# Patient Record
Sex: Female | Born: 1965 | Race: Black or African American | Hispanic: No | Marital: Single | Smoking: Current every day smoker
Health system: Southern US, Community
[De-identification: ages and names within clinical notes are randomized; demographics above are authoritative.]

## PROBLEM LIST (undated history)

## (undated) DIAGNOSIS — Z5189 Encounter for other specified aftercare: Secondary | ICD-10-CM

## (undated) DIAGNOSIS — H543 Unqualified visual loss, both eyes: Secondary | ICD-10-CM

## (undated) DIAGNOSIS — R7303 Prediabetes: Secondary | ICD-10-CM

## (undated) DIAGNOSIS — F419 Anxiety disorder, unspecified: Secondary | ICD-10-CM

## (undated) HISTORY — PX: POLYPECTOMY: SHX149

## (undated) HISTORY — DX: Anxiety disorder, unspecified: F41.9

## (undated) HISTORY — PX: EYE SURGERY: SHX253

## (undated) HISTORY — DX: Unqualified visual loss, both eyes: H54.3

## (undated) HISTORY — DX: Prediabetes: R73.03

## (undated) HISTORY — DX: Encounter for other specified aftercare: Z51.89

---

## 1998-11-17 DIAGNOSIS — G35D Multiple sclerosis, unspecified: Secondary | ICD-10-CM

## 1998-11-17 DIAGNOSIS — G35 Multiple sclerosis: Secondary | ICD-10-CM

## 1998-11-17 HISTORY — DX: Multiple sclerosis: G35

## 1998-11-17 HISTORY — DX: Multiple sclerosis, unspecified: G35.D

## 2001-02-18 ENCOUNTER — Emergency Department (HOSPITAL_COMMUNITY): Admission: EM | Admit: 2001-02-18 | Discharge: 2001-02-18 | Payer: Self-pay | Admitting: Emergency Medicine

## 2001-05-03 ENCOUNTER — Inpatient Hospital Stay (HOSPITAL_COMMUNITY): Admission: EM | Admit: 2001-05-03 | Discharge: 2001-05-05 | Payer: Self-pay | Admitting: Emergency Medicine

## 2001-05-04 ENCOUNTER — Encounter: Payer: Self-pay | Admitting: Internal Medicine

## 2001-08-02 ENCOUNTER — Emergency Department (HOSPITAL_COMMUNITY): Admission: EM | Admit: 2001-08-02 | Discharge: 2001-08-02 | Payer: Self-pay | Admitting: Emergency Medicine

## 2017-04-18 ENCOUNTER — Encounter (HOSPITAL_COMMUNITY): Payer: Self-pay | Admitting: Emergency Medicine

## 2017-04-18 ENCOUNTER — Ambulatory Visit (HOSPITAL_COMMUNITY)
Admission: EM | Admit: 2017-04-18 | Discharge: 2017-04-18 | Disposition: A | Discharge status: Home / Self Care | Payer: Medicaid Other | Attending: Internal Medicine | Admitting: Internal Medicine

## 2017-04-18 DIAGNOSIS — H579 Unspecified disorder of eye and adnexa: Secondary | ICD-10-CM

## 2017-04-18 DIAGNOSIS — Z760 Encounter for issue of repeat prescription: Secondary | ICD-10-CM | POA: Diagnosis not present

## 2017-04-18 NOTE — Discharge Instructions (Signed)
Please contact the opthamologist as I have listed for a f/u appt this week. Please explain the situation to move forward with appt.

## 2017-04-18 NOTE — ED Provider Notes (Signed)
CSN: [id number]     Arrival date & time 04/18/17  1634 History   First MD Initiated Contact with Patient 04/18/17 1709     Chief Complaint  Patient presents with  . Medication Refill   (Consider location/radiation/quality/duration/timing/severity/associated sxs/prior Treatment)  51 yo presents for a refill of prednisone 20mg /day. She presents from [place] to [place] area. She carries a history of "some sort of eye inflammation". She does not know the condition of this. She states that her eyes become watery and itching and sensitive to light. She has taken PRN prednisone when this occurs "for years" and it helps her. She has been out of meds since April. She reports some itchiness to her eyes, no pain. She wants a refill only. She reports some light sensitivity and states that her eyes have been foggy for years without acute change. She is waiting on Medicaid to come through also.         History reviewed. No pertinent past medical history. History reviewed. No pertinent surgical history. History reviewed. No pertinent family history. Social History  Substance Use Topics  . Smoking status: Never Smoker  . Smokeless tobacco: Never Used  . Alcohol use No   OB History    No data available     Review of Systems  All other systems reviewed and are negative.   Allergies  Patient has no known allergies.  Home Medications   Prior to Admission medications   Medication Sig Start Date End Date Taking? Authorizing Provider  predniSONE (DELTASONE) 20 MG tablet Take 20 mg by mouth daily with breakfast.   Yes [provider]   Meds Ordered and Administered this Visit  Medications - No data to display  BP 113/76 (BP Location: Left Arm)   Pulse 95   Temp 98.8 F (37.1 C) (Oral)   Resp 16   SpO2 100%  No data found.   Physical Exam  Constitutional: She is oriented to person, place, and time. She appears well-developed and well-nourished. No distress.  Eyes:  Conjunctivae and EOM are normal. Pupils are equal, round, and reactive to light.  Mild clear discharge from both eyes, mild injection otherwise normal  Neurological: She is alert and oriented to person, place, and time.  Skin: Skin is warm and dry. She is not diaphoretic.  Psychiatric: Her behavior is normal.  Nursing note and vitals reviewed.   Urgent Care Course     Procedures (including critical care time)  Labs Review Labs Reviewed - No data to display  Imaging Review No results found.   Visual Acuity Review  Right Eye Distance:   Left Eye Distance:   Bilateral Distance:    Right Eye Near:   Left Eye Near:    Bilateral Near:         MDM   1. Medication refill   Patient presents for a medication refill of 20mg  of prednisone in which she describes for an "inflammatory eye condition". The terms uveitis or iritis was foreign to her. She did not have acute symptoms, but some watery eyes on exam and long term vision changes. I was happy to prescribe 1 week of prednisone but ultimately I wanted her to see on call Opthalmology for appropriate treatments. I provided her with contact information. Failure to use prednisone orally or dropsc an worsen inflammatory eye if that's truly what this is and cause permanent vision loss therefore again offered to provide prednisone and she refused any further treatment from me today and ask  to leave as she states she has "been through this before". Discharge orders were given to have prompt f/u with Opthalmology for this.    Bjorn Pippin, PA-C 04/18/17 1745

## 2017-04-18 NOTE — ED Triage Notes (Signed)
Pt is here needing a refill on prednisone that she uses for inflammation of eyes  A&IO x4... NAD... Ambulatory

## 2017-04-26 ENCOUNTER — Emergency Department (HOSPITAL_COMMUNITY)
Admission: EM | Admit: 2017-04-26 | Discharge: 2017-04-26 | Disposition: A | Discharge status: Home / Self Care | Payer: Medicaid Other | Attending: Emergency Medicine | Admitting: Emergency Medicine

## 2017-04-26 ENCOUNTER — Emergency Department (HOSPITAL_COMMUNITY): Payer: Medicaid Other

## 2017-04-26 ENCOUNTER — Encounter (HOSPITAL_COMMUNITY): Payer: Self-pay

## 2017-04-26 DIAGNOSIS — R1010 Upper abdominal pain, unspecified: Secondary | ICD-10-CM | POA: Insufficient documentation

## 2017-04-26 DIAGNOSIS — R072 Precordial pain: Secondary | ICD-10-CM | POA: Insufficient documentation

## 2017-04-26 DIAGNOSIS — R109 Unspecified abdominal pain: Secondary | ICD-10-CM

## 2017-04-26 DIAGNOSIS — R918 Other nonspecific abnormal finding of lung field: Secondary | ICD-10-CM | POA: Insufficient documentation

## 2017-04-26 DIAGNOSIS — J189 Pneumonia, unspecified organism: Secondary | ICD-10-CM | POA: Diagnosis not present

## 2017-04-26 LAB — COMPREHENSIVE METABOLIC PANEL
ALBUMIN: 4.2 g/dL (ref 3.5–5.0)
ALK PHOS: 50 U/L (ref 38–126)
ALT: 12 U/L — ABNORMAL LOW (ref 14–54)
ANION GAP: 6 (ref 5–15)
AST: 23 U/L (ref 15–41)
BUN: 10 mg/dL (ref 6–20)
CO2: 25 mmol/L (ref 22–32)
Calcium: 9.6 mg/dL (ref 8.9–10.3)
Chloride: 107 mmol/L (ref 101–111)
Creatinine, Ser: 0.63 mg/dL (ref 0.44–1.00)
GFR calc Af Amer: >60 mL/min (ref >60)
GLUCOSE: 107 mg/dL — AB (ref 65–99)
Potassium: 3.7 mmol/L (ref 3.5–5.1)
Sodium: 138 mmol/L (ref 135–145)
TOTAL PROTEIN: 7.3 g/dL (ref 6.5–8.1)
Total Bilirubin: 0.8 mg/dL (ref 0.3–1.2)

## 2017-04-26 LAB — URINALYSIS, ROUTINE W REFLEX MICROSCOPIC
Bilirubin Urine: NEGATIVE
GLUCOSE, UA: NEGATIVE mg/dL
Ketones, ur: NEGATIVE mg/dL
NITRITE: NEGATIVE
Protein, ur: 30 mg/dL — AB
SPECIFIC GRAVITY, URINE: 1.03 (ref 1.005–1.030)
pH: 5 (ref 5.0–8.0)

## 2017-04-26 LAB — CBC
HCT: 36 % (ref 36.0–46.0)
HEMOGLOBIN: 12.3 g/dL (ref 12.0–15.0)
MCH: 29.4 pg (ref 26.0–34.0)
MCHC: 34.2 g/dL (ref 30.0–36.0)
MCV: 86.1 fL (ref 78.0–100.0)
Platelets: 164 10*3/uL (ref 150–400)
RBC: 4.18 MIL/uL (ref 3.87–5.11)
RDW: 13.9 % (ref 11.5–15.5)
WBC: 5.2 10*3/uL (ref 4.0–10.5)

## 2017-04-26 LAB — LIPASE, BLOOD: Lipase: 24 U/L (ref 11–51)

## 2017-04-26 LAB — I-STAT TROPONIN, ED: Troponin i, poc: 0 ng/mL (ref 0.00–0.08)

## 2017-04-26 LAB — I-STAT BETA HCG BLOOD, ED (MC, WL, AP ONLY): I-stat hCG, quantitative: <5 m[IU]/mL (ref <5)

## 2017-04-26 MED ORDER — IOPAMIDOL (ISOVUE-300) INJECTION 61%
INTRAVENOUS | Status: AC
  Administered 2017-04-26: 75 mL
  Filled 2017-04-26: qty 75

## 2017-04-26 MED ORDER — MORPHINE SULFATE (PF) 4 MG/ML IV SOLN
4.0000 mg | Freq: Once | INTRAVENOUS | Status: AC
  Administered 2017-04-26: 4 mg via INTRAVENOUS
  Filled 2017-04-26: qty 1

## 2017-04-26 MED ORDER — ONDANSETRON HCL 4 MG/2ML IJ SOLN
4.0000 mg | Freq: Once | INTRAMUSCULAR | Status: AC
  Administered 2017-04-26: 4 mg via INTRAVENOUS
  Filled 2017-04-26: qty 2

## 2017-04-26 MED ORDER — AZITHROMYCIN 250 MG PO TABS: 250.0000 mg | ORAL_TABLET | Freq: Every day | ORAL | 0 refills | Status: DC

## 2017-04-26 MED ORDER — KETOROLAC TROMETHAMINE 30 MG/ML IJ SOLN
15.0000 mg | Freq: Once | INTRAMUSCULAR | Status: AC
  Administered 2017-04-26: 15 mg via INTRAVENOUS
  Filled 2017-04-26: qty 1

## 2017-04-26 MED ORDER — NAPROXEN 250 MG PO TABS: 250.0000 mg | ORAL_TABLET | Freq: Two times a day (BID) | ORAL | 0 refills | Status: DC

## 2017-04-26 MED ORDER — SODIUM CHLORIDE 0.9 % IV BOLUS (SEPSIS)
1000.0000 mL | Freq: Once | INTRAVENOUS | Status: AC
  Administered 2017-04-26: 1000 mL via INTRAVENOUS

## 2017-04-26 NOTE — Discharge Instructions (Signed)
Please follow up with primary care and have a repeat CT scan of your chest in about 6-12 weeks.

## 2017-04-26 NOTE — ED Provider Notes (Signed)
[facility] DEPT Provider Note   CSN: [id number] Arrival date & time: 04/26/17  1423     History   Chief Complaint Chief Complaint  Patient presents with  . Abdominal Pain    HPI Katherine Le is a 51 y.o. female.  Katherine Le is a 51 y.o. Female who presents the emergency department complaining of 4 days of left flank pain as well as onset of chest pain on arrival to the emergency department today. Patient reports she's had 4 days of ongoing left flank pain that can fluctuate in intensity but pain is constant. She reports her pain is also worse with certain movements and walking. She denies urinary symptoms. No history of kidney stones. She is taken nothing for treatment of her symptoms today. Patient also reports on arrival to the emergency department she started having left-sided chest pain. She tells me this is nonradiating and denies any shortness of breath. She does report if she breathes deeply she has increased pain to her left flank. She denies history of MI, PE or DVT. She denies shortness of breath. She denies fevers, vomiting, diarrhea, hematuria, dysuria, urinary frequency, urinary urgency, decreased urination, shortness of breath, palpitations, leg pain, leg swelling, recent long travel, rashes or syncope.   The history is provided by the patient and medical records. No language interpreter was used.  Abdominal Pain   Pertinent negatives include fever, diarrhea, nausea, vomiting, dysuria, hematuria and headaches.    History reviewed. No pertinent past medical history.  There are no active problems to display for this patient.   History reviewed. No pertinent surgical history.  OB History    No data available       Home Medications    Prior to Admission medications   Medication Sig Start Date End Date Taking? Authorizing Provider  azithromycin (ZITHROMAX Z-PAK) 250 MG tablet Take 1 tablet (250 mg total) by mouth daily. 500mg  PO day 1, then 250mg  PO  days 205 04/26/17   Waynetta Pean, PA-C  naproxen (NAPROSYN) 250 MG tablet Take 1 tablet (250 mg total) by mouth 2 (two) times daily with a meal. 04/26/17   Waynetta Pean, PA-C    Family History No family history on file.  Social History Social History  Substance Use Topics  . Smoking status: Never Smoker  . Smokeless tobacco: Never Used  . Alcohol use No     Allergies   Patient has no known allergies.   Review of Systems Review of Systems  Constitutional: Negative for chills and fever.  HENT: Negative for congestion and sore throat.   Eyes: Negative for visual disturbance.  Respiratory: Negative for cough, shortness of breath and wheezing.   Cardiovascular: Positive for chest pain. Negative for palpitations and leg swelling.  Gastrointestinal: Positive for abdominal pain. Negative for diarrhea, nausea and vomiting.  Genitourinary: Positive for flank pain. Negative for decreased urine volume, difficulty urinating, dysuria, hematuria and urgency.  Musculoskeletal: Negative for back pain and neck pain.  Skin: Negative for rash.  Neurological: Negative for syncope, weakness, light-headedness and headaches.     Physical Exam Updated Vital Signs BP 111/72   Pulse 66   Temp 99.1 F (37.3 C) (Oral)   Resp (!) 22   SpO2 96%   Physical Exam  Constitutional: She appears well-developed and well-nourished. No distress.  Nontoxic appearing.  HENT:  Head: Normocephalic and atraumatic.  Mouth/Throat: Oropharynx is clear and moist.  Eyes: Conjunctivae are normal. Pupils are equal, round, and reactive to light. Right eye exhibits  no discharge. Left eye exhibits no discharge.  Neck: Normal range of motion. Neck supple. No JVD present. No tracheal deviation present.  Cardiovascular: Normal rate, regular rhythm, normal heart sounds and intact distal pulses.  Exam reveals no gallop and no friction rub.   No murmur heard. Bilateral radial, posterior tibialis and dorsalis pedis  pulses are intact.    Pulmonary/Chest: Effort normal and breath sounds normal. No stridor. No respiratory distress. She has no wheezes. She has no rales. She exhibits tenderness.  Lungs are clear to ascultation bilaterally. Symmetric chest expansion bilaterally. No increased work of breathing. No rales or rhonchi.  Left sided chest wall is TTP and reproduces her pain.   Abdominal: Soft. Bowel sounds are normal. She exhibits no distension and no mass. There is tenderness. There is no rebound and no guarding.  Abdomen is soft. Bowel sounds are present. Patient has a left flank tenderness to palpation. No peritoneal signs. No lower abdominal tenderness to palpation.  Musculoskeletal: Normal range of motion. She exhibits no edema or tenderness.  Lymphadenopathy:    She has no cervical adenopathy.  Neurological: She is alert. No sensory deficit. Coordination normal.  Skin: Skin is warm and dry. Capillary refill takes less than 2 seconds. No rash noted. She is not diaphoretic. No erythema. No pallor.  Psychiatric: She has a normal mood and affect. Her behavior is normal.  Nursing note and vitals reviewed.    ED Treatments / Results  Labs (all labs ordered are listed, but only abnormal results are displayed) Labs Reviewed  COMPREHENSIVE METABOLIC PANEL - Abnormal; Notable for the following:       Result Value   Glucose, Bld 107 (*)    ALT 12 (*)    All other components within normal limits  URINALYSIS, ROUTINE W REFLEX MICROSCOPIC - Abnormal; Notable for the following:    APPearance HAZY (*)    Hgb urine dipstick LARGE (*)    Protein, ur 30 (*)    Leukocytes, UA TRACE (*)    Bacteria, UA RARE (*)    Squamous Epithelial / LPF 6-30 (*)    All other components within normal limits  LIPASE, BLOOD  CBC  I-STAT BETA HCG BLOOD, ED ([facility], [facility], [facility] ONLY)  I-STAT TROPOININ, ED    EKG  EKG Interpretation  Date/Time:  Sunday April 26 2017 17:00:25 EDT Ventricular Rate:  73 PR Interval:    QRS  Duration: 78 QT Interval:  370 QTC Calculation: 408 R Axis:   35 Text Interpretation:  Sinus rhythm Abnormal R-wave progression, early transition Borderline T abnormalities, diffuse leads Confirmed by Hazle Coca [phone]) on 04/26/2017 5:02:40 PM       Radiology Dg Chest 2 View  Result Date: 04/26/2017 CLINICAL DATA:  Left abdominal pain and nausea for the past 4 days. Smoker. EXAM: CHEST  2 VIEW COMPARISON:  Report dated 05/03/2001 FINDINGS: Normal sized heart. Tortuous aorta. Mildly elevated left hemidiaphragm. Mild diffuse peribronchial thickening and accentuation of the interstitial markings. Unremarkable bones. IMPRESSION: No acute abnormality.  Mild chronic bronchitic changes. Electronically Signed   By: Claudie Revering M.D.   On: 04/26/2017 17:47   Ct Chest W Contrast  Result Date: 04/26/2017 CLINICAL DATA:  Chest and left flank pain today. Stone study demonstrating bibasilar pulmonary nodules. Nonsmoker. No history of primary malignancy. EXAM: CT CHEST WITH CONTRAST TECHNIQUE: Multidetector CT imaging of the chest was performed during intravenous contrast administration. CONTRAST:  42mL ISOVUE-300 IOPAMIDOL (ISOVUE-300) INJECTION 61% COMPARISON:  Abdominal CT of earlier  today. FINDINGS: Cardiovascular: Tortuous thoracic aorta. Normal heart size, without pericardial effusion. Pulmonary artery enlargement is mild, with a 3.2 cm outflow tract. Mediastinum/Nodes: No supraclavicular adenopathy. No axillary adenopathy. No mediastinal or hilar adenopathy. Soft tissue density in the anterior mediastinum is likely residual thymus. Lungs/Pleura: No pleural fluid. Anteromedial right upper lobe patchy airspace disease and bronchiectasis on image 54/series 7. Bilateral ill-defined pulmonary nodules, with larger areas of right base nodularity with traversing air bronchograms, favoring infectious etiology. Example in the posterior right upper lobe on image 71/ series 7 and 13 mm and within the right lower lobe on  image 81/series 7 at 10 mm. Upper Abdomen: Normal imaged portions of the liver, spleen, stomach, pancreas, adrenal glands, kidneys. Musculoskeletal: No acute osseous abnormality. IMPRESSION: 1. Bilateral ill-defined pulmonary nodules with areas of nodular airspace disease in the right upper and lower lobes. Findings favor infection, including atypical bacterial or fungal etiologies. Correlate with infectious symptoms. Recommend appropriate antibiotic therapy and follow-up chest CT at 6-12 weeks. 2. No thoracic adenopathy. Probable residual thymic tissue in the anterior mediastinum. Recommend attention on follow-up. 3. Pulmonary artery enlargement suggests pulmonary arterial hypertension. Electronically Signed   By: Abigail Miyamoto M.D.   On: 04/26/2017 21:20   Ct Renal Stone Study  Result Date: 04/26/2017 CLINICAL DATA:  Left flank and back pain for the past 3 days. EXAM: CT ABDOMEN AND PELVIS WITHOUT CONTRAST TECHNIQUE: Multidetector CT imaging of the abdomen and pelvis was performed following the standard protocol without IV contrast. COMPARISON:  Abdomen and pelvis CT report dated 05/03/2001 and abdomen ultrasound report dated 05/04/2001. FINDINGS: Lower chest: Breathing motion blurring. 1.4 x 1.1 cm mildly irregular, rounded nodular density in the posterior aspect of the right middle lobe, measured on axial image number 8 of series 4. 1.0 x 0.9 cm oval, mildly irregular nodular density in the right lower lobe on image number 12 of series 4, containing an air bronchograms on sagittal image number 28. 5 mm irregular nodular density in the superior aspect of the right middle lobe on image number 5 of series 4 and on sagittal image number 26. 3 mm nodular density in the right middle lobe on image number 8 of series 4 and sagittal image number 32. 2 mm nodular density in left lower lobe on image number 7 of series 4. 4 mm nodular density in the lingula on image number 6 of series 4 and sagittal image number 83. 5 mm  nodular density in the lingula on image number 9 of series 4 and on sagittal image number 78. Hepatobiliary: No focal liver abnormality is seen. No gallstones, gallbladder wall thickening, or biliary dilatation. Pancreas: Unremarkable. No pancreatic ductal dilatation or surrounding inflammatory changes. Spleen: Normal in size without focal abnormality. Adrenals/Urinary Tract: Adrenal glands are unremarkable. Kidneys are normal, without renal calculi, focal lesion, or hydronephrosis. Bladder is unremarkable. Stomach/Bowel: Unremarkable stomach, small bowel and colon. No evidence of appendicitis. Vascular/Lymphatic: No significant vascular findings are present. No enlarged abdominal or pelvic lymph nodes. Reproductive: Enlarged uterus containing at least 2 fibroids. No adnexal masses. Other: None. Musculoskeletal: L5-S1 facet degenerative changes. IMPRESSION: 1. Multiple irregular nodular densities in both lungs, as described above. These have appearances concerning for multifocal infection, such as fungal infection. A neoplastic process is less likely but not excluded. A chest CT with contrast would be useful for assessing extensive disease and assessing for associated adenopathy. 2. No acute abdominal or pelvic abnormality. 3. Uterine fibroids. Electronically Signed   By: Claudie Revering  M.D.   On: 04/26/2017 18:44    Procedures Procedures (including critical care time)  Medications Ordered in ED Medications  sodium chloride 0.9 % bolus 1,000 mL (0 mLs Intravenous Stopped 04/26/17 1927)  morphine 4 MG/ML injection 4 mg (4 mg Intravenous Given 04/26/17 1713)  ondansetron (ZOFRAN) injection 4 mg (4 mg Intravenous Given 04/26/17 1714)  ketorolac (TORADOL) 30 MG/ML injection 15 mg (15 mg Intravenous Given 04/26/17 1933)  iopamidol (ISOVUE-300) 61 % injection (75 mLs  Contrast Given 04/26/17 2040)     Initial Impression / Assessment and Plan / ED Course  I have reviewed the triage vital signs and the nursing  notes.  Pertinent labs & imaging results that were available during my care of the patient were reviewed by me and considered in my medical decision making (see chart for details).    This  is a 51 y.o. Female who presents the emergency department complaining of 4 days of left flank pain as well as onset of chest pain on arrival to the emergency department today. Patient reports she's had 4 days of ongoing left flank pain that can fluctuate in intensity but pain is constant. She reports her pain is also worse with certain movements and walking. She denies urinary symptoms. No history of kidney stones. She is taken nothing for treatment of her symptoms today. Patient also reports on arrival to the emergency department she started having left-sided chest pain. She tells me this is nonradiating and denies any shortness of breath. She does report if she breathes deeply she has increased pain to her left flank. She denies history of MI, PE or DVT. She denies shortness of breath. She denies fevers, vomiting, diarrhea, hematuria, dysuria, urinary frequency, urinary urgency. On exam the patient is afebrile nontoxic appearing. Lungs clear to auscultation bilaterally. She has substernal chest wall tenderness to palpation which reproduces her pain. She has mild left flank tenderness to palpation. Abdomen is soft and nontender. Lower extremity edema or tenderness. EKG shows no evidence of STEMI. Troponin is not elevated. Pregnancy test is negative. Lipase is within normal limits. CMP is unremarkable. Preserved kidney function. CBC is within normal limits. No leukocytosis. Urinalysis shows no evidence of infection, but large hemoglobin. Chest x-ray showed some mild chronic bronchitic changes but no acute abnormality. CT renal stone study showed no acute abdominal or pelvic abnormality. It did show multiple irregular nodular densities in both lungs that are concerning for multifocal infection or fungal infection.  Recommend CT chest with contrast. Patient agrees with plan for CT chest with contrast. CT chest with contrast shows bilateral pulmonary nodules with area of nodular airspace disease in the right upper and lower lobes. Findings favor infection including atypical bacterial or fungal etiologies. Recommends follow-up chest CT in 6-12 weeks. There is also suggestion of pulmonary arterial hypertension. At reevaluation patient is resting comfortably in the room. She is tolerating by mouth. She is feeling better after pain medication in the emergency department. I discussed all of the test results. We'll discharge with azithromycin to cover atypical bacterial infections. Patient denies having any increased cough or fevers. No recent travel. Will have her follow up with primary care and have a repeat chest CT in about 6-12 weeks. Naproxen for her flank pain. Suspect this is musculoskeletal. Return precautions discussed. I advised the patient to follow-up with their primary care provider this week. I advised the patient to return to the emergency department with new or worsening symptoms or new concerns. The patient verbalized understanding  and agreement with plan.      Final Clinical Impressions(s) / ED Diagnoses   Final diagnoses:  Atypical pneumonia  Pulmonary nodules/lesions, multiple  Left flank pain  Precordial pain    New Prescriptions Discharge Medication List as of 04/26/2017 11:15 PM    START taking these medications   Details  azithromycin (ZITHROMAX Z-PAK) 250 MG tablet Take 1 tablet (250 mg total) by mouth daily. 500mg  PO day 1, then 250mg  PO days 205, Starting Sun 04/26/2017, Print    naproxen (NAPROSYN) 250 MG tablet Take 1 tablet (250 mg total) by mouth 2 (two) times daily with a meal., Starting Sun 04/26/2017, Print         Waynetta Pean, PA-C 04/26/17 2339    Quintella Reichert, MD 04/30/17 [phone]

## 2017-04-26 NOTE — ED Triage Notes (Signed)
Patient complains of left sided abdominal pain with nausea x 1 week. States that her pain is worse with movement and inspiration. No vag. discharge

## 2018-11-17 HISTORY — PX: COLONOSCOPY: SHX174

## 2020-03-21 ENCOUNTER — Telehealth: Payer: Self-pay

## 2020-03-22 NOTE — Telephone Encounter (Signed)
ERROR

## 2020-11-01 ENCOUNTER — Emergency Department (HOSPITAL_COMMUNITY)
Admission: EM | Admit: 2020-11-01 | Discharge: 2020-11-02 | Disposition: A | Discharge status: Home / Self Care | Payer: Medicaid Other | Attending: Emergency Medicine | Admitting: Emergency Medicine

## 2020-11-01 ENCOUNTER — Other Ambulatory Visit: Payer: Self-pay

## 2020-11-01 DIAGNOSIS — G35 Multiple sclerosis: Secondary | ICD-10-CM | POA: Insufficient documentation

## 2020-11-01 DIAGNOSIS — M62838 Other muscle spasm: Secondary | ICD-10-CM | POA: Diagnosis present

## 2020-11-01 LAB — BASIC METABOLIC PANEL
Anion gap: 8 (ref 5–15)
BUN: 15 mg/dL (ref 6–20)
CO2: 26 mmol/L (ref 22–32)
Calcium: 9.7 mg/dL (ref 8.9–10.3)
Chloride: 104 mmol/L (ref 98–111)
Creatinine, Ser: 0.74 mg/dL (ref 0.44–1.00)
GFR, Estimated: >60 mL/min (ref >60)
Glucose, Bld: 113 mg/dL — ABNORMAL HIGH (ref 70–99)
Potassium: 4 mmol/L (ref 3.5–5.1)
Sodium: 138 mmol/L (ref 135–145)

## 2020-11-01 LAB — CBC
HCT: 36.5 % (ref 36.0–46.0)
Hemoglobin: 12.1 g/dL (ref 12.0–15.0)
MCH: 29.1 pg (ref 26.0–34.0)
MCHC: 33.2 g/dL (ref 30.0–36.0)
MCV: 87.7 fL (ref 80.0–100.0)
Platelets: 193 10*3/uL (ref 150–400)
RBC: 4.16 MIL/uL (ref 3.87–5.11)
RDW: 13.4 % (ref 11.5–15.5)
WBC: 5.9 10*3/uL (ref 4.0–10.5)
nRBC: 0 % (ref 0.0–0.2)

## 2020-11-01 NOTE — ED Triage Notes (Signed)
Pt here after running out of her medications. Pt with hx MS and states she feels like it is flaring up. Pt c/o burning to R leg and having spasms.

## 2020-11-02 ENCOUNTER — Other Ambulatory Visit: Payer: Self-pay

## 2020-11-02 MED ORDER — HYDROCODONE-ACETAMINOPHEN 5-325 MG PO TABS: 1.0000 | ORAL_TABLET | ORAL | 0 refills | Status: AC | PRN

## 2020-11-02 MED ORDER — PREDNISONE 10 MG PO TABS: ORAL_TABLET | ORAL | 0 refills | Status: DC

## 2020-11-02 NOTE — ED Notes (Signed)
Pt ambulatory to bathroom with steady gait and no assistance. NAD noted.

## 2020-11-02 NOTE — Discharge Instructions (Addendum)
Schedule to see Neurology for evaluation.

## 2020-11-02 NOTE — ED Provider Notes (Signed)
[facility]ERGENC[facility] DEPARTMENT Provider Note   CSN: [id number] Arrival date & time: 11/01/20  1707     History Chief Complaint  Patient presents with  . Multiple Sclerosis  . Spasms    Katherine Le is a 54 y.o. female.  Pt complains of body aches.  Pt reports she is out of her medications.  Pt moved here from [place].  Pt reports she went to adult [facility] and is suppose to be scheduled with Neurology.  Pt has not had medication since October.Pt complains of bodyaches   The history is provided by the patient. No language interpreter was used.       No past medical history on file.  There are no problems to display for this patient.   No past surgical history on file.   OB History   No obstetric history on file.     No family history on file.  Social History   Tobacco Use  . Smoking status: Never Smoker  . Smokeless tobacco: Never Used  Substance Use Topics  . Alcohol use: No  . Drug use: No    Home Medications Prior to Admission medications   Medication Sig Start Date End Date Taking? Authorizing Provider  azithromycin (ZITHROMAX Z-PAK) 250 MG tablet Take 1 tablet (250 mg total) by mouth daily. 500mg  PO day 1, then 250mg  PO days 205 04/26/17   Waynetta Pean, PA-C  naproxen (NAPROSYN) 250 MG tablet Take 1 tablet (250 mg total) by mouth 2 (two) times daily with a meal. 04/26/17   Waynetta Pean, PA-C    Allergies    Patient has no known allergies.  Review of Systems   Review of Systems  All other systems reviewed and are negative.   Physical Exam Updated Vital Signs BP 118/89 (BP Location: Right Arm)   Pulse 62   Temp 97.6 F (36.4 C) (Oral)   Resp 15   Ht 5\' 1"  (1.549 m)   Wt 63.5 kg   SpO2 100%   BMI 26.45 kg/m   Physical Exam Vitals and nursing note reviewed.  Constitutional:      Appearance: She is well-developed and well-nourished.  HENT:     Head: Normocephalic.     Nose: Nose normal.      Mouth/Throat:     Mouth: Mucous membranes are moist.  Eyes:     Extraocular Movements: EOM normal.  Cardiovascular:     Rate and Rhythm: Normal rate and regular rhythm.  Pulmonary:     Effort: Pulmonary effort is normal.     Breath sounds: Normal breath sounds.  Abdominal:     General: There is no distension.  Musculoskeletal:        General: Normal range of motion.     Cervical back: Normal range of motion.  Skin:    General: Skin is warm.  Neurological:     General: No focal deficit present.     Mental Status: She is alert and oriented to person, place, and time.  Psychiatric:        Mood and Affect: Mood and affect and mood normal.     ED Results / Procedures / Treatments   Labs (all labs ordered are listed, but only abnormal results are displayed) Labs Reviewed  BASIC METABOLIC PANEL - Abnormal; Notable for the following components:      Result Value   Glucose, Bld 113 (*)    All other components within normal limits  CBC    EKG None  Radiology No results found.  Procedures Procedures (including critical care time)  Medications Ordered in ED Medications - No data to display  ED Course  I have reviewed the triage vital signs and the nursing notes.  Pertinent labs & imaging results that were available during my care of the patient were reviewed by me and considered in my medical decision making (see chart for details).    MDM Rules/Calculators/A&P                          MDM: I will treat with prednisone  Final Clinical Impression(s) / ED Diagnoses Final diagnoses:  Multiple sclerosis ([place])    Rx / DC Orders ED Discharge Orders         Ordered    predniSONE (DELTASONE) 10 MG tablet       Note to Pharmacy: Please given 12 day taper dose pack   11/02/20 1011    HYDROcodone-acetaminophen (NORCO/VICODIN) 5-325 MG tablet  Every 4 hours PRN        11/02/20 1011        An After Visit Summary was printed and given to the patient.    Fransico Meadow, PA-C 11/02/20 [place], [place], DO 11/02/20 1217

## 2021-04-26 ENCOUNTER — Other Ambulatory Visit: Payer: Self-pay

## 2021-04-26 ENCOUNTER — Ambulatory Visit (HOSPITAL_COMMUNITY)
Admission: EM | Admit: 2021-04-26 | Discharge: 2021-04-26 | Disposition: A | Discharge status: Home / Self Care | Payer: Medicaid Other

## 2021-04-26 ENCOUNTER — Emergency Department (HOSPITAL_COMMUNITY)
Admission: EM | Admit: 2021-04-26 | Discharge: 2021-04-27 | Disposition: A | Discharge status: Home / Self Care | Payer: Medicaid Other | Attending: Emergency Medicine | Admitting: Emergency Medicine

## 2021-04-26 ENCOUNTER — Encounter (HOSPITAL_COMMUNITY): Payer: Self-pay | Admitting: Emergency Medicine

## 2021-04-26 ENCOUNTER — Encounter (HOSPITAL_COMMUNITY): Payer: Self-pay | Admitting: *Deleted

## 2021-04-26 DIAGNOSIS — R140 Abdominal distension (gaseous): Secondary | ICD-10-CM | POA: Diagnosis not present

## 2021-04-26 DIAGNOSIS — R1084 Generalized abdominal pain: Secondary | ICD-10-CM | POA: Diagnosis present

## 2021-04-26 DIAGNOSIS — K5900 Constipation, unspecified: Secondary | ICD-10-CM | POA: Diagnosis not present

## 2021-04-26 DIAGNOSIS — J029 Acute pharyngitis, unspecified: Secondary | ICD-10-CM | POA: Diagnosis not present

## 2021-04-26 LAB — CBC WITH DIFFERENTIAL/PLATELET
Abs Immature Granulocytes: 0.02 10*3/uL (ref 0.00–0.07)
Basophils Absolute: 0 10*3/uL (ref 0.0–0.1)
Basophils Relative: 0 %
Eosinophils Absolute: 0.1 10*3/uL (ref 0.0–0.5)
Eosinophils Relative: 1 %
HCT: 35.2 % — ABNORMAL LOW (ref 36.0–46.0)
Hemoglobin: 11.6 g/dL — ABNORMAL LOW (ref 12.0–15.0)
Immature Granulocytes: 0 %
Lymphocytes Relative: 20 %
Lymphs Abs: 1.5 10*3/uL (ref 0.7–4.0)
MCH: 29.2 pg (ref 26.0–34.0)
MCHC: 33 g/dL (ref 30.0–36.0)
MCV: 88.7 fL (ref 80.0–100.0)
Monocytes Absolute: 0.2 10*3/uL (ref 0.1–1.0)
Monocytes Relative: 3 %
Neutro Abs: 5.5 10*3/uL (ref 1.7–7.7)
Neutrophils Relative %: 76 %
Platelets: 199 10*3/uL (ref 150–400)
RBC: 3.97 MIL/uL (ref 3.87–5.11)
RDW: 13.4 % (ref 11.5–15.5)
WBC: 7.3 10*3/uL (ref 4.0–10.5)
nRBC: 0 % (ref 0.0–0.2)

## 2021-04-26 LAB — URINALYSIS, ROUTINE W REFLEX MICROSCOPIC
Bilirubin Urine: NEGATIVE
Glucose, UA: NEGATIVE mg/dL
Ketones, ur: NEGATIVE mg/dL
Leukocytes,Ua: NEGATIVE
Nitrite: NEGATIVE
Protein, ur: 100 mg/dL — AB
Specific Gravity, Urine: 1.018 (ref 1.005–1.030)
pH: 5 (ref 5.0–8.0)

## 2021-04-26 MED ORDER — ONDANSETRON 4 MG PO TBDP
ORAL_TABLET | ORAL | Status: AC
  Filled 2021-04-26: qty 1

## 2021-04-26 MED ORDER — ONDANSETRON 4 MG PO TBDP
4.0000 mg | ORAL_TABLET | Freq: Once | ORAL | Status: AC
  Administered 2021-04-26: 4 mg via ORAL

## 2021-04-26 NOTE — ED Triage Notes (Signed)
The pt has constipation that is normal for her she takes laxatives but it is not helping.  nauseated

## 2021-04-26 NOTE — ED Triage Notes (Signed)
Pt presents with nausea and sore throat xs 2-3 days ago.

## 2021-04-26 NOTE — ED Provider Notes (Signed)
[facility]    CSN: [id number] Arrival date & time: 04/26/21  1752      History   Chief Complaint Chief Complaint  Patient presents with   Sore Throat   Nausea    HPI Katherine Le is a 55 y.o. female.   Patient here for evaluation of sore throat and nausea that has been ongoing for the past 2 to 3 days.  Reports stopping smoking several months ago.  Also reports taking Linzess for constipation.  Reports nausea and states that annually "it feels like if I throw up its going to be poop."  Reports last bowel movement was yesterday.  Denies any trauma, injury, or other precipitating event.  Denies any specific alleviating or aggravating factors.  Denies any fevers, chest pain, shortness of breath, N/V/D, numbness, tingling, weakness, or headaches.     The history is provided by the patient.  Sore Throat Associated symptoms include abdominal pain.   History reviewed. No pertinent past medical history.  There are no problems to display for this patient.   History reviewed. No pertinent surgical history.  OB History   No obstetric history on file.      Home Medications    Prior to Admission medications   Medication Sig Start Date End Date Taking? Authorizing Provider  atorvastatin (LIPITOR) 10 MG tablet Take by mouth. 03/06/21  Yes [provider]  Cholecalciferol 1.25 MG (50000 UT) capsule Take 1 capsule by mouth once a week. 03/05/21  Yes [provider]  Dimethyl Fumarate 120 MG CPDR Take 120 mg by mouth 2 times a day for 7 days, then take 240 mg (2 tablets) by mouth twice a day. 03/22/21  Yes [provider]  linaclotide (LINZESS) 290 MCG CAPS capsule Take 1 capsule by mouth daily. 01/20/19  Yes [provider]  pantoprazole (PROTONIX) 40 MG tablet Take by mouth. 12/07/18  Yes [provider]  AMITIZA 24 MCG capsule Take 24 mcg by mouth 2 (two) times daily. 04/10/21   [provider]  azithromycin (ZITHROMAX  Z-PAK) 250 MG tablet Take 1 tablet (250 mg total) by mouth daily. 500mg  PO day 1, then 250mg  PO days 205 04/26/17   Waynetta Pean, PA-C  gabapentin (NEURONTIN) 300 MG capsule Take 1 capsule by mouth 3 (three) times daily. 04/21/21   [provider]  HYDROcodone-acetaminophen (NORCO/VICODIN) 5-325 MG tablet Take 1 tablet by mouth every 4 (four) hours as needed for moderate pain. 11/02/20 11/02/21  Fransico Meadow, PA-C  naproxen (NAPROSYN) 250 MG tablet Take 1 tablet (250 mg total) by mouth 2 (two) times daily with a meal. 04/26/17   Waynetta Pean, PA-C  predniSONE (DELTASONE) 10 MG tablet 6,6,5,5,4,4,3,3,2,2,1,1 taper . 11/02/20   Fransico Meadow, PA-C    Family History History reviewed. No pertinent family history.  Social History Social History   Tobacco Use   Smoking status: Never   Smokeless tobacco: Never  Substance Use Topics   Alcohol use: No   Drug use: No     Allergies   Patient has no known allergies.   Review of Systems Review of Systems  HENT:  Positive for sore throat.   Gastrointestinal:  Positive for abdominal pain and nausea. Negative for constipation, diarrhea and vomiting.  All other systems reviewed and are negative.   Physical Exam Triage Vital Signs ED Triage Vitals  Enc Vitals Group     BP 04/26/21 1821 (!) 135/101     Pulse Rate 04/26/21 1821 94  Resp 04/26/21 1821 16     Temp 04/26/21 1821 99.7 F (37.6 C)     Temp Source 04/26/21 1821 Oral     SpO2 04/26/21 1821 100 %     Weight --      Height --      Head Circumference --      Peak Flow --      Pain Score 04/26/21 1817 0     Pain Loc --      Pain Edu? --      Excl. in [place]? --    No data found.  Updated Vital Signs BP (!) 135/101 (BP Location: Left Arm)   Pulse 94   Temp 99.7 F (37.6 C) (Oral)   Resp 16   SpO2 100%   Visual Acuity Right Eye Distance:   Left Eye Distance:   Bilateral Distance:    Right Eye Near:   Left Eye Near:    Bilateral Near:     Physical  Exam Vitals and nursing note reviewed.  Constitutional:      General: She is not in acute distress.    Appearance: Normal appearance. She is not ill-appearing, toxic-appearing or diaphoretic.  HENT:     Head: Normocephalic and atraumatic.     Nose: No congestion or rhinorrhea.     Mouth/Throat:     Mouth: Mucous membranes are moist.     Pharynx: No pharyngeal swelling, oropharyngeal exudate, posterior oropharyngeal erythema or uvula swelling.     Tonsils: No tonsillar exudate or tonsillar abscesses. 0 on the right. 0 on the left.  Eyes:     Conjunctiva/sclera: Conjunctivae normal.  Cardiovascular:     Rate and Rhythm: Normal rate.     Pulses: Normal pulses.  Pulmonary:     Effort: Pulmonary effort is normal.  Abdominal:     General: Abdomen is flat. There is no distension.     Palpations: Abdomen is soft.  Musculoskeletal:        General: Normal range of motion.     Cervical back: Normal range of motion.  Skin:    General: Skin is warm and dry.  Neurological:     General: No focal deficit present.     Mental Status: She is alert and oriented to person, place, and time.  Psychiatric:        Mood and Affect: Mood normal.     UC Treatments / Results  Labs (all labs ordered are listed, but only abnormal results are displayed) Labs Reviewed - No data to display  EKG   Radiology No results found.  Procedures Procedures (including critical care time)  Medications Ordered in UC Medications  ondansetron (ZOFRAN-ODT) disintegrating tablet 4 mg (4 mg Oral Given 04/26/21 1827)    Initial Impression / Assessment and Plan / UC Course  I have reviewed the triage vital signs and the nursing notes.  Pertinent labs & imaging results that were available during my care of the patient were reviewed by me and considered in my medical decision making (see chart for details).    Assessment negative for red flags or concerns.  For constipation brat or bland diet and advance as  tolerated.  May also use ginger or mints for nausea.  Recommend MiraLAX daily and drinking plenty of fluids.  Recommend following up with primary care provider and GI for further evaluation of constipation as this is an ongoing problem.  For sore throat discussed conservative symptom management as described in discharge instructions.  Follow-up  with primary care as needed. Final Clinical Impressions(s) / UC Diagnoses   Final diagnoses:  Constipation, unspecified constipation type  Sore throat     Discharge Instructions      Try a BRAT (bananas, rice, applesause, toast) or bland food diet for the next few days.  Stick with foods that are gentle on your stomach.  Avoid foods that are difficult to digest, such as diary.  As you feel better, you can start eating like you do normally.    You can use ginger (ginger ale, ginger candy) and mint for nausea.    Take miralax once a day until you have a good bowel movement.  You can also take Colace or another stool softener to help.    Follow up with your primary care provider as soon as possible for re-evaluation of constipation.    You can take Tylenol and/or Ibuprofen as needed for fever reduction and pain relief.    For sore throat: try warm salt water gargles, cepacol lozenges, throat spray, warm tea or water with lemon/honey, popsicles or ice, or OTC cold relief medicine for throat discomfort.    It is important to stay hydrated: drink plenty of fluids (water, gatorade/powerade/pedialyte, juices, or teas) to keep your throat moisturized and help further relieve irritation/discomfort.   Return or go to the Emergency Department if symptoms worsen or do not improve in the next few days.      ED Prescriptions   None    PDMP not reviewed this encounter.   Pearson Forster, NP 04/26/21 1850

## 2021-04-26 NOTE — Discharge Instructions (Addendum)
Try a BRAT (bananas, rice, applesause, toast) or bland food diet for the next few days.  Stick with foods that are gentle on your stomach.  Avoid foods that are difficult to digest, such as diary.  As you feel better, you can start eating like you do normally.    You can use ginger (ginger ale, ginger candy) and mint for nausea.    Take miralax once a day until you have a good bowel movement.  You can also take Colace or another stool softener to help.    Follow up with your primary care provider as soon as possible for re-evaluation of constipation.    You can take Tylenol and/or Ibuprofen as needed for fever reduction and pain relief.    For sore throat: try warm salt water gargles, cepacol lozenges, throat spray, warm tea or water with lemon/honey, popsicles or ice, or OTC cold relief medicine for throat discomfort.    It is important to stay hydrated: drink plenty of fluids (water, gatorade/powerade/pedialyte, juices, or teas) to keep your throat moisturized and help further relieve irritation/discomfort.   Return or go to the Emergency Department if symptoms worsen or do not improve in the next few days.

## 2021-04-26 NOTE — ED Provider Notes (Signed)
Emergency Medicine Provider Triage Evaluation Note  Katherine Le , a 55 y.o. female  was evaluated in triage.  Pt complains of abd pain and nausea. She further c/o constipation.  Review of Systems  Positive: Abd pain, nausea, constipation Negative: fever  Physical Exam  BP (!) 129/97 (BP Location: Right Arm)   Pulse 97   Temp 99.2 F (37.3 C) (Oral)   Resp 16   Ht 5\' 1"  (1.549 m)   Wt 63.5 kg   SpO2 99%   BMI 26.45 kg/m  Gen:   Awake, no distress   Resp:  Normal effort  MSK:   Moves extremities without difficulty  Other:    Medical Decision Making  Medically screening exam initiated at 7:10 PM.  Appropriate orders placed.  Katherine Le was informed that the remainder of the evaluation will be completed by another provider, this initial triage assessment does not replace that evaluation, and the importance of remaining in the ED until their evaluation is complete.     Katherine Le 04/26/21 1910    Katherine Greek, MD 04/27/21 [phone]

## 2021-04-27 ENCOUNTER — Encounter (HOSPITAL_COMMUNITY): Payer: Self-pay | Admitting: Radiology

## 2021-04-27 ENCOUNTER — Emergency Department (HOSPITAL_COMMUNITY): Payer: Medicaid Other

## 2021-04-27 LAB — COMPREHENSIVE METABOLIC PANEL
ALT: 14 U/L (ref 0–44)
AST: 21 U/L (ref 15–41)
Albumin: 4.1 g/dL (ref 3.5–5.0)
Alkaline Phosphatase: 60 U/L (ref 38–126)
Anion gap: 6 (ref 5–15)
BUN: 9 mg/dL (ref 6–20)
CO2: 27 mmol/L (ref 22–32)
Calcium: 9.5 mg/dL (ref 8.9–10.3)
Chloride: 102 mmol/L (ref 98–111)
Creatinine, Ser: 0.68 mg/dL (ref 0.44–1.00)
GFR, Estimated: >60 mL/min (ref >60)
Glucose, Bld: 101 mg/dL — ABNORMAL HIGH (ref 70–99)
Potassium: 3.6 mmol/L (ref 3.5–5.1)
Sodium: 135 mmol/L (ref 135–145)
Total Bilirubin: 1.2 mg/dL (ref 0.3–1.2)
Total Protein: 8 g/dL (ref 6.5–8.1)

## 2021-04-27 LAB — LIPASE, BLOOD: Lipase: 26 U/L (ref 11–51)

## 2021-04-27 MED ORDER — IOHEXOL 300 MG/ML  SOLN
75.0000 mL | Freq: Once | INTRAMUSCULAR | Status: AC | PRN
  Administered 2021-04-27: 75 mL via INTRAVENOUS

## 2021-04-27 NOTE — Discharge Instructions (Addendum)
Please call your gastroenterologist to schedule a follow-up appointment

## 2021-04-27 NOTE — ED Provider Notes (Signed)
[facility] EMERGENCY DEPARTMENT Provider Note   CSN: [id number] Arrival date & time: 04/26/21  1859     History Chief Complaint  Patient presents with   Abdominal Pain    Katherine Le is a 55 y.o. female.  Patient presents for evaluation of abdominal pain, bloating and constipation.  Patient reports that she has a history of constipation and uses a lot of laxatives.  Usually this relieves her symptoms.  Patient had a small bowel movement 36 hours ago but still feels distended with diffuse abdominal cramping.  No vomiting.  No fever.      History reviewed. No pertinent past medical history.  There are no problems to display for this patient.   History reviewed. No pertinent surgical history.   OB History   No obstetric history on file.     History reviewed. No pertinent family history.  Social History   Tobacco Use   Smoking status: Never   Smokeless tobacco: Never  Substance Use Topics   Alcohol use: No   Drug use: No    Home Medications Prior to Admission medications   Medication Sig Start Date End Date Taking? Authorizing Provider  AMITIZA 24 MCG capsule Take 24 mcg by mouth 2 (two) times daily with a meal. 04/10/21  Yes [provider]  atorvastatin (LIPITOR) 10 MG tablet Take 10 mg by mouth at bedtime. 03/06/21  Yes [provider]  gabapentin (NEURONTIN) 300 MG capsule Take 300 mg by mouth 3 (three) times daily. 04/21/21  Yes [provider]  Cholecalciferol 1.25 MG (50000 UT) capsule Take 1 capsule by mouth once a week. 03/05/21   [provider]  Dimethyl Fumarate 120 MG CPDR Take 120 mg by mouth 2 times a day for 7 days, then take 240 mg (2 tablets) by mouth twice a day. 03/22/21   [provider]  HYDROcodone-acetaminophen (NORCO/VICODIN) 5-325 MG tablet Take 1 tablet by mouth every 4 (four) hours as needed for moderate pain. 11/02/20 11/02/21  Fransico Meadow, PA-C  linaclotide (LINZESS) 290  MCG CAPS capsule Take 1 capsule by mouth daily. 01/20/19   [provider]  naproxen (NAPROSYN) 250 MG tablet Take 1 tablet (250 mg total) by mouth 2 (two) times daily with a meal. 04/26/17   Waynetta Pean, PA-C  pantoprazole (PROTONIX) 40 MG tablet Take by mouth. 12/07/18   [provider]    Allergies    Patient has no known allergies.  Review of Systems   Review of Systems  Gastrointestinal:  Positive for abdominal distention, abdominal pain and constipation.  All other systems reviewed and are negative.  Physical Exam Updated Vital Signs BP (!) 135/91   Pulse 63   Temp (!) 97.2 F (36.2 C) (Temporal)   Resp 14   Ht 5\' 1"  (1.549 m)   Wt 63.5 kg   SpO2 96%   BMI 26.45 kg/m   Physical Exam Vitals and nursing note reviewed.  Constitutional:      General: She is not in acute distress.    Appearance: Normal appearance. She is well-developed.  HENT:     Head: Normocephalic and atraumatic.     Right Ear: Hearing normal.     Left Ear: Hearing normal.     Nose: Nose normal.  Eyes:     Conjunctiva/sclera: Conjunctivae normal.     Pupils: Pupils are equal, round, and reactive to light.  Cardiovascular:     Rate and Rhythm: Regular rhythm.  Heart sounds: S1 normal and S2 normal. No murmur heard.   No friction rub. No gallop.  Pulmonary:     Effort: Pulmonary effort is normal. No respiratory distress.     Breath sounds: Normal breath sounds.  Chest:     Chest wall: No tenderness.  Abdominal:     General: Bowel sounds are normal.     Palpations: Abdomen is soft.     Tenderness: There is no abdominal tenderness. There is no guarding or rebound. Negative signs include Murphy's sign and McBurney's sign.     Hernia: No hernia is present.  Musculoskeletal:        General: Normal range of motion.     Cervical back: Normal range of motion and neck supple.  Skin:    General: Skin is warm and dry.     Findings: No rash.  Neurological:     Mental Status: She  is alert and oriented to person, place, and time.     GCS: GCS eye subscore is 4. GCS verbal subscore is 5. GCS motor subscore is 6.     Cranial Nerves: No cranial nerve deficit.     Sensory: No sensory deficit.     Coordination: Coordination normal.  Psychiatric:        Speech: Speech normal.        Behavior: Behavior normal.        Thought Content: Thought content normal.    ED Results / Procedures / Treatments   Labs (all labs ordered are listed, but only abnormal results are displayed) Labs Reviewed  CBC WITH DIFFERENTIAL/PLATELET - Abnormal; Notable for the following components:      Result Value   Hemoglobin 11.6 (*)    HCT 35.2 (*)    All other components within normal limits  URINALYSIS, ROUTINE W REFLEX MICROSCOPIC - Abnormal; Notable for the following components:   Hgb urine dipstick MODERATE (*)    Protein, ur 100 (*)    Bacteria, UA RARE (*)    All other components within normal limits  COMPREHENSIVE METABOLIC PANEL - Abnormal; Notable for the following components:   Glucose, Bld 101 (*)    All other components within normal limits  LIPASE, BLOOD    EKG None  Radiology CT ABDOMEN PELVIS W CONTRAST  Result Date: 04/27/2021 CLINICAL DATA:  Epigastric abdominal pain for 3 days. EXAM: CT ABDOMEN AND PELVIS WITH CONTRAST TECHNIQUE: Multidetector CT imaging of the abdomen and pelvis was performed using the standard protocol following bolus administration of intravenous contrast. CONTRAST:  53mL OMNIPAQUE IOHEXOL 300 MG/ML  SOLN COMPARISON:  04/26/2017 FINDINGS: Lower chest: Patchy nodular density at the right lung base, not present on the prior study. This could be an area of post pneumonic scarring change. No pleural effusion. The heart is normal in size. No pericardial effusion. Hepatobiliary: No hepatic lesions or intrahepatic biliary dilatation. Gallbladder is unremarkable. No common bile duct dilatation. Pancreas: No mass, inflammation or ductal dilatation. Spleen:  Normal size.  No focal lesions. Adrenals/Urinary Tract: The adrenal glands are unremarkable. No renal lesions or renal calculi or hydronephrosis. The bladder is grossly normal. Stomach/Bowel: The stomach, duodenum, small bowel and colon are grossly normal without oral contrast. No acute inflammatory process, mass lesions or obstructive findings. Duodenal diverticulum noted. The appendix is not identified for certain but do not see any findings suspicious for acute appendicitis. Vascular/Lymphatic: The aorta is normal in caliber. No dissection. The branch vessels are patent. The major venous structures are patent. No mesenteric  or retroperitoneal mass or adenopathy. Small scattered lymph nodes are noted. Reproductive: Enlarged fibroid uterus. The ovaries are unremarkable. Other: No pelvic mass or adenopathy. No free pelvic fluid collections. No inguinal mass or adenopathy. No abdominal wall hernia or subcutaneous lesions. Musculoskeletal: No significant bony findings. IMPRESSION: 1. No acute abdominal/pelvic findings, mass lesions or adenopathy. 2. Enlarged fibroid uterus. 3. Patchy nodular opacity at the right lung base. Recommend correlation with any pulmonary symptoms. A follow-up chest CT in 3-4 months is suggested to make sure this resolves. Electronically Signed   By: Marijo Sanes M.D.   On: 04/27/2021 10:32    Procedures Procedures   Medications Ordered in ED Medications  iohexol (OMNIPAQUE) 300 MG/ML solution 75 mL (75 mLs Intravenous Contrast Given 04/27/21 1017)    ED Course  I have reviewed the triage vital signs and the nursing notes.  Pertinent labs & imaging results that were available during my care of the patient were reviewed by me and considered in my medical decision making (see chart for details).    MDM Rules/Calculators/A&P                         Presents with abdominal discomfort, abdominal distention and sensation of feeling like she constantly has to have a bowel movement.   Patient thinks she is constipated.  She has history of chronic constipation.  Lab work essentially normal, very reassuring.  Abdominal exam with mild tenderness, no focal tenderness.  CT scan without any inflammatory changes, obstruction or other acute pathology to explain her symptoms.  No significant constipation.  Patient reports that she does have a GI doctor, will need to follow-up with GI for further consideration of treatment of her symptoms.  No further emergency work-up is necessary.  Does not require hospitalization.  She is already on Protonix and Amitiza.  It appears that she was historically on Linzess as well.  Final Clinical Impression(s) / ED Diagnoses Final diagnoses:  Generalized abdominal pain    Rx / DC Orders ED Discharge Orders     None        Orpah Greek, MD 04/27/21 1056

## 2021-04-27 NOTE — ED Notes (Signed)
Patient transported to CT 

## 2021-12-19 ENCOUNTER — Encounter: Payer: Self-pay | Admitting: Neurology

## 2021-12-19 ENCOUNTER — Ambulatory Visit (INDEPENDENT_AMBULATORY_CARE_PROVIDER_SITE_OTHER): Payer: Medicaid Other | Admitting: Neurology

## 2021-12-19 VITALS — BP 120/82 | HR 72 | Ht 61.0 in | Wt 139.5 lb

## 2021-12-19 DIAGNOSIS — R269 Unspecified abnormalities of gait and mobility: Secondary | ICD-10-CM | POA: Diagnosis not present

## 2021-12-19 DIAGNOSIS — H539 Unspecified visual disturbance: Secondary | ICD-10-CM

## 2021-12-19 DIAGNOSIS — Z79899 Other long term (current) drug therapy: Secondary | ICD-10-CM

## 2021-12-19 DIAGNOSIS — H44113 Panuveitis, bilateral: Secondary | ICD-10-CM

## 2021-12-19 DIAGNOSIS — F418 Other specified anxiety disorders: Secondary | ICD-10-CM | POA: Diagnosis not present

## 2021-12-19 DIAGNOSIS — R5383 Other fatigue: Secondary | ICD-10-CM | POA: Insufficient documentation

## 2021-12-19 DIAGNOSIS — G35 Multiple sclerosis: Secondary | ICD-10-CM

## 2021-12-19 DIAGNOSIS — R208 Other disturbances of skin sensation: Secondary | ICD-10-CM | POA: Insufficient documentation

## 2021-12-19 MED ORDER — DIMETHYL FUMARATE 120 MG PO CPDR: DELAYED_RELEASE_CAPSULE | ORAL | 0 refills | Status: DC

## 2021-12-19 MED ORDER — DIMETHYL FUMARATE 240 MG PO CPDR: DELAYED_RELEASE_CAPSULE | ORAL | 11 refills | Status: AC

## 2021-12-19 MED ORDER — SERTRALINE HCL 50 MG PO TABS: 50.0000 mg | ORAL_TABLET | Freq: Every day | ORAL | 4 refills | Status: DC

## 2021-12-19 NOTE — Progress Notes (Signed)
[facility]  PATIENT: Katherine Le DOB: February 05, 1966  REFERRING DOCTOR OR PCP: Vassie Moment, MD SOURCE: Patient, notes from primary care, office and laboratory and imaging results through care everywhere  _________________________________   HISTORICAL  CHIEF COMPLAINT:  Chief Complaint  Patient presents with   New Patient (Initial Visit)    Rm 2, alone. Pt referred by Vassie Moment, MD for MS eval. Dx around 3 years ago. Was started on a DMT for her MS in [place] but when relocating pt ran out and unable to remember what medication she was taking, has been out for several months. Pt c/o of chronic fatigue, L leg weakness. Burning in R leg off/on. Would like to have infusions for her MS.     HISTORY OF PRESENT ILLNESS:  I had the pleasure of seeing patient, Katherine Le, at the [place] [facility] for neurologic consultation regarding her multiple sclerosis.  She is a 56 year old woman with multiple sclerosis diagnosed in 2019.  She had some symptoms with gait for several years before the diagnosis.   MS History: She was diagnosed with MS in 2019 after presenting with leg paresthesias.  She had a burning sensation in her right leg from hip to feet.    She had an LP 02/12/2018 showing oligoclonal bands in CSF not present in serum.   MBP was mildly positive.    In retrospect, she had a limp back in 2011/2012.    She di have bran MRIs at that time, worrisome for MS but was not diagnosed.  She was diagnosed by Dr. Wynelle Fanny (?).    She was placed on Tecfidera.   She tolerated it well.    She  has lived in the [place] area most of the past but moved to [place] last year.     She saw Dr. Shela Leff in [place] who re-initiated the DMF.    However, she went back to [place] for a while so did not start.  Currently, she has a limp due to left leg weakness, stiffness and clumsiness.    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   She  has burning dysesthesias on the right leg and mild hand numbness.    Vision is poor out of both eye.      She has been diagnosed with panuveitis / iridocyclitis.        She denies issues with hr bladder.     She has fatigue everyday, worse than a few years ago.   Sleep is poor at night due to both sleep onset and sleep maintenance insomnia.     She has anxiety and depression.  She gets frustrated easily.    She feels cognition is fine.      IMAGING: She had an MRI of the brain in 2017 but we do not have those results  Labs: LP 02/12/2018 showing oligoclonal bands in CSF not present in serum.   MBP was mildly positive.   CSF ACE, VDRL and Lyme were negative Labs 12/28/2018 showed normal ESR and CRP 4/18//2022 Vit D = 16.4; Hep B, Hep C negative, TSH normal.   EKG 10/04/2020 showed NSR with normal intervals and no ischemic changes.     REVIEW OF SYSTEMS: Constitutional: No fevers, chills, sweats, or change in appetite Eyes: No visual changes, double vision, eye pain Ear, nose and throat: No hearing loss, ear pain, nasal congestion, sore throat Cardiovascular: No chest pain, palpitations Respiratory:  No shortness of breath at  rest or with exertion.   No wheezes GastrointestinaI: No nausea, vomiting, diarrhea, abdominal pain, fecal incontinence Genitourinary:  No dysuria, urinary retention or frequency.  No nocturia. Musculoskeletal:  No neck pain, back pain Integumentary: No rash, pruritus, skin lesions Neurological: as above Psychiatric: No depression at this time.  No anxiety Endocrine: No palpitations, diaphoresis, change in appetite, change in weigh or increased thirst Hematologic/Lymphatic:  No anemia, purpura, petechiae. Allergic/Immunologic: No itchy/runny eyes, nasal congestion, recent allergic reactions, rashes  ALLERGIES: No Known Allergies  HOME MEDICATIONS:  Current Outpatient Medications:    Dimethyl Fumarate 120 MG CPDR, One po bid x 1 week, Disp: 14 capsule, Rfl: 0    Dimethyl Fumarate 240 MG CPDR, One po bid, Disp: 60 capsule, Rfl: 11   sertraline (ZOLOFT) 50 MG tablet, Take 1 tablet (50 mg total) by mouth daily., Disp: 90 tablet, Rfl: 4  PAST MEDICAL HISTORY: Past Medical History:  Diagnosis Date   Blind in both eyes    MS (multiple sclerosis) ([place]) 2000   Prediabetes     PAST SURGICAL HISTORY: Past Surgical History:  Procedure Laterality Date   CESAREAN SECTION  1987   EYE SURGERY Bilateral    10+ years ago    FAMILY HISTORY: Family History  Problem Relation Age of Onset   Diabetes Mother    Diabetes Maternal Aunt    Diabetes Maternal Grandmother     SOCIAL HISTORY:  Social History   Socioeconomic History   Marital status: Single    Spouse name: Not on file   Number of children: 3   Years of education: Not on file   Highest education level: 11th grade  Occupational History   Not on file  Tobacco Use   Smoking status: Some Days    Packs/day: 0.25    Years: 37.00    Pack years: 9.25    Types: Cigarettes   Smokeless tobacco: Never  Substance and Sexual Activity   Alcohol use: Yes    Comment: twice a year   Drug use: No   Sexual activity: Never  Other Topics Concern   Not on file  Social History Narrative   Lives with daughter currently, planning on moving alone   Left handed   Caffeine: rare/none   Social Determinants of Health   Financial Resource Strain: Not on file  Food Insecurity: Not on file  Transportation Needs: Not on file  Physical Activity: Not on file  Stress: Not on file  Social Connections: Not on file  Intimate Partner Violence: Not on file     PHYSICAL EXAM  Vitals:   12/19/21 1307  BP: 120/82  Pulse: 72  SpO2: 97%  Weight: 139 lb 8 oz (63.3 kg)  Height: 5' 1"  (1.549 m)    Body mass index is 26.36 kg/m.   Vision Screening   Right eye Left eye Both eyes  Without correction 0 20/200 0  With correction        General: The patient is well-developed and well-nourished and in no  acute distress  HEENT:  Head is [place]/AT.  Sclera are anicteric.     Neck: No carotid bruits are noted.  The neck is nontender.  Cardiovascular: The heart has a regular rate and rhythm with a normal S1 and S2. There were no murmurs, gallops or rubs.    Skin: Extremities are without rash or  edema.  Musculoskeletal:  Back is nontender  Neurologic Exam  Mental status: The patient is alert and oriented x 3 at  the time of the examination. The patient has apparent normal recent and remote memory, with an apparently normal attention span and concentration ability.   Speech is normal.  Cranial nerves: Extraocular movements are full. Pupils are equal, round, and reactive to light and accomodation.  She reports the right eye sees colors more brightly but OS has better acuity..  Facial symmetry is present. There is good facial sensation to soft touch bilaterally.Facial strength is normal.  Trapezius and sternocleidomastoid strength is normal. No dysarthria is noted.  The tongue is midline, and the patient has symmetric elevation of the soft palate. No obvious hearing deficits are noted.  Motor:  Muscle bulk is normal.   Tone is normal. Strength is  5 / 5 in the arms.  Rapid alternating movements were performed well in the hands.  She has mildly reduced strength in the left leg with 4+/5 iliopsoas and 4/5 foot and ankle extension.  Strength was 4++/5 in the right iliopsoas and 5/5 elsewhere in the right leg..   Sensory: She reports symmetric sensation to touch and vibration in the arms.  She reports reduced vibration and touch sensation in the right leg relative to the left leg..  Coordination: Cerebellar testing reveals good finger-nose-finger and reduced heel-to-shin on the left.   Gait and station: Station is normal.   The gait is wide with a reduced stride.  She has a mild foot drop on the left.  Tandem gait is difficult.  Romberg is negative.  Reflexes: Deep tendon reflexes are symmetric and normal  in the arms and mildly increased in the left leg relative to the right leg..   Plantar responses are flexor.    DIAGNOSTIC DATA (LABS, IMAGING, TESTING) - I reviewed patient records, labs, notes, testing and imaging myself where available.  Lab Results  Component Value Date   WBC 7.3 04/26/2021   HGB 11.6 (L) 04/26/2021   HCT 35.2 (L) 04/26/2021   MCV 88.7 04/26/2021   PLT 199 04/26/2021      Component Value Date/Time   NA 135 04/27/2021 0005   K 3.6 04/27/2021 0005   CL 102 04/27/2021 0005   CO2 27 04/27/2021 0005   GLUCOSE 101 (H) 04/27/2021 0005   BUN 9 04/27/2021 0005   CREATININE 0.68 04/27/2021 0005   CALCIUM 9.5 04/27/2021 0005   PROT 8.0 04/27/2021 0005   ALBUMIN 4.1 04/27/2021 0005   AST 21 04/27/2021 0005   ALT 14 04/27/2021 0005   ALKPHOS 60 04/27/2021 0005   BILITOT 1.2 04/27/2021 0005   GFRNONAA >60 04/27/2021 0005   GFRAA >60 04/26/2017 1447       ASSESSMENT AND PLAN  Multiple sclerosis ([place]) - Plan: CBC with Differential/Platelet, MR BRAIN W WO CONTRAST, MR CERVICAL SPINE W WO CONTRAST, Comprehensive metabolic panel, Ambulatory referral to Ophthalmology  High risk medication use - Plan: CBC with Differential/Platelet, Comprehensive metabolic panel  Gait disturbance - Plan: MR BRAIN W WO CONTRAST, MR CERVICAL SPINE W WO CONTRAST  Depression with anxiety  Other fatigue  Dysesthesia  Panuveitis of both eyes - Plan: Ambulatory referral to Ophthalmology  Visual disturbance - Plan: Ambulatory referral to Ophthalmology   In summary, Katherine Le is a 56 year old woman who was diagnosed with MS in 2019 but had some symptoms with gait for least a few years earlier.  She had been started on Tecfidera after her diagnosis.  She tolerated well and she felt her MS was stable while she was on it.  She has not been  on it for the past year since moving to the area.  I will get her restarted and titrated up to 240 mg twice daily.  Additionally, she has some  depression and anxiety and also poor sleep.  I will have her start sertraline 50 mg.  Hopefully this will help both with mood and sleep.  I would also consider adding imipramine as it may help the dysesthesias and sleep.  She reports fatigue.  I will hold off on treatment until we see if her sleep improves.  Additionally I will check an MRI of the brain and cervical spine.  She has active lesions we will need to consider a more efficacious medication.  She has a history of panuveitis and visual loss and I will get her worked into an ophthalmologic practice.  She had been seen at [facility]  She is advised to stay active and exercise as tolerated.  I will see her back in 4 months but she should call sooner if new or worsening neurologic symptoms.  Thank you for asking me to see Katherine Le.  Please let me know if I can be of further assistance with her or other patients in the future.   Alisia Vanengen A. Felecia Shelling, MD, [facility] 03/19/6921, 3:00 PM Certified in Neurology, Clinical Neurophysiology, Sleep Medicine and Neuroimaging  [facility] [address] [place], [place] [address] [phone]

## 2021-12-20 LAB — CBC WITH DIFFERENTIAL/PLATELET
Basophils Absolute: 0 10*3/uL (ref 0.0–0.2)
Basos: 0 %
EOS (ABSOLUTE): 0 10*3/uL (ref 0.0–0.4)
Eos: 1 %
Hematocrit: 35 % (ref 34.0–46.6)
Hemoglobin: 11.5 g/dL (ref 11.1–15.9)
Immature Grans (Abs): 0 10*3/uL (ref 0.0–0.1)
Immature Granulocytes: 0 %
Lymphocytes Absolute: 2.1 10*3/uL (ref 0.7–3.1)
Lymphs: 47 %
MCH: 28.6 pg (ref 26.6–33.0)
MCHC: 32.9 g/dL (ref 31.5–35.7)
MCV: 87 fL (ref 79–97)
Monocytes Absolute: 0.2 10*3/uL (ref 0.1–0.9)
Monocytes: 4 %
Neutrophils Absolute: 2.2 10*3/uL (ref 1.4–7.0)
Neutrophils: 48 %
Platelets: 193 10*3/uL (ref 150–450)
RBC: 4.02 x10E6/uL (ref 3.77–5.28)
RDW: 13.2 % (ref 11.7–15.4)
WBC: 4.5 10*3/uL (ref 3.4–10.8)

## 2021-12-20 LAB — COMPREHENSIVE METABOLIC PANEL
ALT: 14 IU/L (ref 0–32)
AST: 18 IU/L (ref 0–40)
Albumin/Globulin Ratio: 1.8 (ref 1.2–2.2)
Albumin: 4.9 g/dL (ref 3.8–4.9)
Alkaline Phosphatase: 66 IU/L (ref 44–121)
BUN/Creatinine Ratio: 14 (ref 9–23)
BUN: 9 mg/dL (ref 6–24)
Bilirubin Total: 0.6 mg/dL (ref 0.0–1.2)
CO2: 26 mmol/L (ref 20–29)
Calcium: 9.8 mg/dL (ref 8.7–10.2)
Chloride: 103 mmol/L (ref 96–106)
Creatinine, Ser: 0.63 mg/dL (ref 0.57–1.00)
Globulin, Total: 2.8 g/dL (ref 1.5–4.5)
Glucose: 91 mg/dL (ref 70–99)
Potassium: 4.3 mmol/L (ref 3.5–5.2)
Sodium: 139 mmol/L (ref 134–144)
Total Protein: 7.7 g/dL (ref 6.0–8.5)
eGFR: 106 mL/min/{1.73_m2} (ref >59)

## 2021-12-23 ENCOUNTER — Telehealth: Payer: Self-pay | Admitting: Neurology

## 2021-12-23 ENCOUNTER — Other Ambulatory Visit: Payer: Self-pay | Admitting: Family Medicine

## 2021-12-23 DIAGNOSIS — R109 Unspecified abdominal pain: Secondary | ICD-10-CM

## 2021-12-23 DIAGNOSIS — R198 Other specified symptoms and signs involving the digestive system and abdomen: Secondary | ICD-10-CM

## 2021-12-23 NOTE — Telephone Encounter (Signed)
Called the pt and reviewed the lab results. Advised there was nothing concerning and lab work looked good. Pt verbalized understanding. Pt had no questions at this time but was encouraged to call back if questions arise.

## 2021-12-23 NOTE — Telephone Encounter (Signed)
-----   Message from Britt Bottom, MD sent at 12/21/2021  6:12 PM EST ----- Please let the patient know that the lab work is fine.

## 2021-12-23 NOTE — Telephone Encounter (Signed)
Sent to Dr. Groat ph # [phone] 

## 2021-12-23 NOTE — Telephone Encounter (Signed)
[facility] [employer] pending faxed notes.  °

## 2021-12-24 NOTE — Telephone Encounter (Signed)
Medicaid [employer] did not approve the MRI Cervical because they want the results of the MRI Brain.  They stated "without this additional information, your request did not meet criteria for approval found in NIA Clincal guidelines for Cervical spine."  I asked to speak with a nurse but they informed me it is in MD clinical review so only a peer to peer.  If you would like to do a peer to peer the phone number is [phone] and the tracking number is [id number]. She said the case does close on 12/26/21.    Brain auth: [id number] (exp. 12/23/21 to 02/21/22)

## 2021-12-24 NOTE — Telephone Encounter (Signed)
Katherine Le- I spoke with Dr. Felecia Shelling. He said the patient can proceed with MRI brain since cervical denied. Depending on MRI brain results, we can proceed with cervical at a later date if needed

## 2021-12-24 NOTE — Telephone Encounter (Signed)
Noted, thank you. I will send the order to GI. They will reach out to the patient to schedule the MRI Brain.

## 2021-12-31 ENCOUNTER — Other Ambulatory Visit: Payer: Medicaid Other

## 2022-01-08 ENCOUNTER — Other Ambulatory Visit: Payer: Medicaid Other

## 2022-01-23 ENCOUNTER — Ambulatory Visit
Admission: RE | Admit: 2022-01-23 | Discharge: 2022-01-23 | Disposition: A | Discharge status: Home / Self Care | Payer: Medicaid Other | Source: Ambulatory Visit | Attending: Family Medicine | Admitting: Family Medicine

## 2022-01-23 DIAGNOSIS — R198 Other specified symptoms and signs involving the digestive system and abdomen: Secondary | ICD-10-CM

## 2022-01-23 DIAGNOSIS — R109 Unspecified abdominal pain: Secondary | ICD-10-CM

## 2022-02-03 ENCOUNTER — Other Ambulatory Visit: Payer: Self-pay

## 2022-02-03 ENCOUNTER — Other Ambulatory Visit: Payer: Medicaid Other

## 2022-02-03 ENCOUNTER — Ambulatory Visit
Admission: RE | Admit: 2022-02-03 | Discharge: 2022-02-03 | Disposition: A | Discharge status: Home / Self Care | Payer: Medicaid Other | Source: Ambulatory Visit | Attending: Neurology | Admitting: Neurology

## 2022-02-03 DIAGNOSIS — R269 Unspecified abnormalities of gait and mobility: Secondary | ICD-10-CM

## 2022-02-03 DIAGNOSIS — G35 Multiple sclerosis: Secondary | ICD-10-CM

## 2022-02-03 MED ORDER — GADOBENATE DIMEGLUMINE 529 MG/ML IV SOLN
12.0000 mL | Freq: Once | INTRAVENOUS | Status: AC | PRN
  Administered 2022-02-03: 12 mL via INTRAVENOUS

## 2022-02-06 ENCOUNTER — Telehealth: Payer: Self-pay | Admitting: Neurology

## 2022-02-06 NOTE — Telephone Encounter (Signed)
Called the patient and reviewed the results from the MRI of the brain. Pt verbalized understanding. ?Pt had no questions at this time but was encouraged to call back if questions arise. ? ?

## 2022-02-06 NOTE — Telephone Encounter (Signed)
-----   Message from Britt Bottom, MD sent at 02/05/2022  6:41 PM EDT ----- ?Please let her know that the MRI of the brain showed a small number of old MS lesions but there were none that looked new ?

## 2022-02-19 ENCOUNTER — Encounter: Payer: Self-pay | Admitting: Gastroenterology

## 2022-02-19 ENCOUNTER — Telehealth: Payer: Self-pay | Admitting: Gastroenterology

## 2022-02-19 NOTE — Telephone Encounter (Signed)
Hey Dr. Rush Landmark, ? ?I called patient and she did not do the upper endoscopy.  At the time, she was going back and forth between [place] and [place] and has not done any other procedures.  I hope this is helpful to you. ? ?Thank you. ?

## 2022-02-19 NOTE — Telephone Encounter (Signed)
Good morning Dr. Rush Landmark, ? ?(Doc of the Day 02/19/22, a.m.) ? ?This patient was referred here by her PCP and she is requesting a transfer of care to our office.  Her records are in [place].  She has recently moved here from [place] and is currently having issues with chronic abdominal pain and constipation.  Please let me know if you approve of her transfer. ? ?Thank you. ?

## 2022-02-19 NOTE — Telephone Encounter (Signed)
Patient elected to schedule Direct EGD/Colon.  PV appt 03/20/22 at 1 p.m.  EGD/colon scheduled for 04/10/22 at 1:30 p.m., arriving at 12:30 p.m. ?Thank you. ?

## 2022-02-19 NOTE — Telephone Encounter (Signed)
Thank you for this update. ?Based on review of her chart and her medical history and symptoms and need for surveillance, I think it is worthwhile for [place] to pursue both her upper and lower endoscopy. ?I am happy to have the patient come directly for an EGD/colonoscopy as an outpatient to the [facility] to begin her next steps in work-up. ?If she would like to be seen in clinic, she can be scheduled with me next available or may use a 3:50 PM slot. ?Let me know what she decides. ?Thanks. ?GM ?

## 2022-02-19 NOTE — Telephone Encounter (Signed)
I have had a chance to review her history briefly. ?She is due for colonoscopy this year based on [facility] (suspect polyps).  She also was planned for an upper endoscopy last year by [facility], though I do not know if she ended up undergoing that procedure or not.  She has a history of H. Pylori from 2020.  If she has had any other endoscopic work-up done in this area, since moving (from [place]) we need to have that first to review.  After I have this back, I will be able to decide whether we can potentially help with transition of this patient's care and what endoscopic evaluation may be necessary.  Thank you. ?GM ?

## 2022-03-20 ENCOUNTER — Ambulatory Visit (AMBULATORY_SURGERY_CENTER): Payer: Medicaid Other | Admitting: *Deleted

## 2022-03-20 VITALS — Ht 61.0 in | Wt 139.0 lb

## 2022-03-20 DIAGNOSIS — K219 Gastro-esophageal reflux disease without esophagitis: Secondary | ICD-10-CM

## 2022-03-20 DIAGNOSIS — R110 Nausea: Secondary | ICD-10-CM

## 2022-03-20 DIAGNOSIS — Z86010 Personal history of colonic polyps: Secondary | ICD-10-CM

## 2022-03-20 MED ORDER — PEG 3350-KCL-NA BICARB-NACL 420 G PO SOLR: 4000.0000 mL | Freq: Once | ORAL | 0 refills | Status: AC

## 2022-03-20 NOTE — Progress Notes (Signed)
Patient's pre-visit was done today over the phone with the patient. Name,DOB and address verified. Patient denies any allergies to Eggs and Soy. Patient denies any problems with anesthesia/sedation. Patient is not taking any diet pills or blood thinners. No home Oxygen. Insurance confirmed with patient.  ? ?Prep instructions mailed to pt-pt is aware. Patient understands to call us back with any questions or concerns. Patient is aware of our care-partner policy.  ? ?The patient is COVID-19 vaccinated.   ?

## 2022-04-04 ENCOUNTER — Telehealth: Payer: Self-pay | Admitting: Gastroenterology

## 2022-04-04 NOTE — Telephone Encounter (Signed)
Patient called states she purchased 7.9 oz bottle of Murelax instead of 4.1 oz bottle of Murelax. Patient is inquiring the difference in intake from 7.9 oz bottle since it is a bigger bottle. Please give a call back and advise.  Thank you.

## 2022-04-04 NOTE — Telephone Encounter (Signed)
Answered patient 's questions. Informed pt to use 7 doses of Miralax from her big bottle to equal the 119 grams. Pt verbalizes understanding.

## 2022-04-10 ENCOUNTER — Encounter: Payer: Self-pay | Admitting: Gastroenterology

## 2022-04-10 ENCOUNTER — Ambulatory Visit (AMBULATORY_SURGERY_CENTER): Payer: Medicaid Other | Admitting: Gastroenterology

## 2022-04-10 VITALS — BP 107/74 | HR 64 | Temp 96.6°F | Resp 13 | Ht 61.0 in | Wt 139.0 lb

## 2022-04-10 DIAGNOSIS — Z86010 Personal history of colonic polyps: Secondary | ICD-10-CM | POA: Diagnosis not present

## 2022-04-10 DIAGNOSIS — K219 Gastro-esophageal reflux disease without esophagitis: Secondary | ICD-10-CM

## 2022-04-10 DIAGNOSIS — D124 Benign neoplasm of descending colon: Secondary | ICD-10-CM | POA: Diagnosis not present

## 2022-04-10 DIAGNOSIS — D122 Benign neoplasm of ascending colon: Secondary | ICD-10-CM

## 2022-04-10 DIAGNOSIS — D127 Benign neoplasm of rectosigmoid junction: Secondary | ICD-10-CM | POA: Diagnosis not present

## 2022-04-10 DIAGNOSIS — K2950 Unspecified chronic gastritis without bleeding: Secondary | ICD-10-CM | POA: Diagnosis not present

## 2022-04-10 MED ORDER — SODIUM CHLORIDE 0.9 % IV SOLN: 500.0000 mL | Freq: Once | INTRAVENOUS | Status: DC

## 2022-04-10 NOTE — Progress Notes (Signed)
Called to room to assist during endoscopic procedure.  Patient ID and intended procedure confirmed with present staff. Received instructions for my participation in the procedure from the performing physician.  

## 2022-04-10 NOTE — Progress Notes (Signed)
Pt's states no medical or surgical changes since previsit or office visit. 

## 2022-04-10 NOTE — Op Note (Signed)
[place] Patient Name: Katherine Le Procedure Date: 04/10/2022 2:54 PM MRN: [id number] Endoscopist: Justice Britain , MD Age: 56 Referring MD:  Date of Birth: Jan 30, 1966 Gender: Female Account #: [id number] Procedure:                Upper GI endoscopy Indications:              Lower abdominal pain, Upper abdominal pain,                            Heartburn, Gastro-esophageal reflux disease,                            Exclusion of Helicobacter pylori (previous history                            of HP based on prior documentation) Medicines:                Monitored Anesthesia Care Procedure:                Pre-Anesthesia Assessment:                           - Prior to the procedure, a History and Physical                            was performed, and patient medications and                            allergies were reviewed. The patient's tolerance of                            previous anesthesia was also reviewed. The risks                            and benefits of the procedure and the sedation                            options and risks were discussed with the patient.                            All questions were answered, and informed consent                            was obtained. Prior Anticoagulants: The patient has                            taken no previous anticoagulant or antiplatelet                            agents. ASA Grade Assessment: III - A patient with                            severe systemic disease. After reviewing the risks  and benefits, the patient was deemed in                            satisfactory condition to undergo the procedure.                           After obtaining informed consent, the endoscope was                            passed under direct vision. Throughout the                            procedure, the patient's blood pressure, pulse, and                            oxygen saturations  were monitored continuously. The                            GIF [id number] #[id number] was introduced through the                            mouth, and advanced to the second part of duodenum.                            The upper GI endoscopy was accomplished without                            difficulty. The patient tolerated the procedure. Scope In: Scope Out: Findings:                 No gross lesions were noted in the entire                            esophagus. Biopsies were taken with a cold forceps                            for histology.                           The Z-line was irregular and was found 35 cm from                            the incisors.                           A 1 cm hiatal hernia was present.                           Patchy mildly erythematous mucosa without bleeding                            was found in the gastric body and in the gastric                            antrum.  No other gross lesions were noted in the entire                            examined stomach. Biopsies were taken with a cold                            forceps for histology and Helicobacter pylori                            testing.                           No gross lesions were noted in the duodenal bulb,                            in the first portion of the duodenum and in the                            second portion of the duodenum. Biopsies were taken                            with a cold forceps for histology. Complications:            No immediate complications. Estimated Blood Loss:     Estimated blood loss was minimal. Impression:               - No gross lesions in esophagus. Biopsied.                           - Z-line irregular, 35 cm from the incisors.                           - 1 cm hiatal hernia.                           - Erythematous mucosa in the gastric body and                            antrum. No other gross lesions in the stomach.                             Biopsied.                           - No gross lesions in the duodenal bulb, in the                            first portion of the duodenum and in the second                            portion of the duodenum. Biopsied. Recommendation:           - Proceed to scheduled colonoscopy.                           -  Continue present medications.                           - Await pathology results.                           - Will consider PPI therapy in future based on                            final pathology and patient's discomforts due to                            findings of active gastritis.                           - The findings and recommendations were discussed                            with the patient.                           - The findings and recommendations were discussed                            with the patient's family. Justice Britain, MD 04/10/2022 3:37:54 PM

## 2022-04-10 NOTE — Op Note (Signed)
[facility] Patient Name: Katherine Le Procedure Date: 04/10/2022 2:54 PM MRN: [id number] Endoscopist: Justice Britain , MD Age: 56 Referring MD:  Date of Birth: [birth year] Gender: Female Account #: [id number] Procedure:                Colonoscopy Indications:              High risk colon cancer surveillance: Personal                            history of colonic polyps Medicines:                Monitored Anesthesia Care Procedure:                Pre-Anesthesia Assessment:                           - Prior to the procedure, a History and Physical                            was performed, and patient medications and                            allergies were reviewed. The patient's tolerance of                            previous anesthesia was also reviewed. The risks                            and benefits of the procedure and the sedation                            options and risks were discussed with the patient.                            All questions were answered, and informed consent                            was obtained. Prior Anticoagulants: The patient has                            taken no previous anticoagulant or antiplatelet                            agents. ASA Grade Assessment: III - A patient with                            severe systemic disease. After reviewing the risks                            and benefits, the patient was deemed in                            satisfactory condition to undergo the procedure.  After obtaining informed consent, the colonoscope                            was passed under direct vision. Throughout the                            procedure, the patient's blood pressure, pulse, and                            oxygen saturations were monitored continuously. The                            Olympus PCF-H190DL [id number]) Colonoscope was                            introduced through the anus  and advanced to the the                            cecum, identified by appendiceal orifice and                            ileocecal valve. The colonoscopy was somewhat                            difficult due to a redundant colon, significant                            looping and a tortuous colon. Successful completion                            of the procedure was aided by changing the                            patient's position, using manual pressure,                            withdrawing and reinserting the scope,                            straightening and shortening the scope to obtain                            bowel loop reduction and using scope torsion. The                            patient tolerated the procedure. The quality of the                            bowel preparation was adequate. The ileocecal                            valve, appendiceal orifice, and rectum were  photographed. Scope In: 3:11:09 PM Scope Out: 3:31:30 PM Scope Withdrawal Time: 0 hours 10 minutes 34 seconds  Total Procedure Duration: 0 hours 20 minutes 21 seconds  Findings:                 The digital rectal exam findings include                            hemorrhoids. Pertinent negatives include no                            palpable rectal lesions.                           The colon (entire examined portion) was grossly                            redundant.                           Four sessile polyps were found in the recto-sigmoid                            colon, descending colon and ascending colon. The                            polyps were 4 to 6 mm in size. These polyps were                            removed with a cold snare. Resection and retrieval                            were complete.                           Multiple small-mouthed diverticula were found in                            the entire colon.                           Normal mucosa was  found in the entire colon                            otherwise.                           Non-bleeding non-thrombosed external and internal                            hemorrhoids were found during retroflexion, during                            perianal exam and during digital exam. The                            hemorrhoids were Grade II (internal  hemorrhoids                            that prolapse but reduce spontaneously). Complications:            No immediate complications. Estimated Blood Loss:     Estimated blood loss was minimal. Impression:               - Hemorrhoids found on digital rectal exam.                           - Redundant colon.                           - Four 4 to 6 mm polyps at the recto-sigmoid colon,                            in the descending colon and in the ascending colon,                            removed with a cold snare. Resected and retrieved.                           - Diverticulosis in the entire examined colon.                           - Normal mucosa in the entire examined colon                            otherwise.                           - Non-bleeding non-thrombosed external and internal                            hemorrhoids. Recommendation:           - The patient will be observed post-procedure,                            until all discharge criteria are met.                           - Discharge patient to home.                           - Patient has a contact number available for                            emergencies. The signs and symptoms of potential                            delayed complications were discussed with the                            patient. Return to normal activities tomorrow.  Written discharge instructions were provided to the                            patient.                           - High fiber diet.                           - Use FiberCon 1-2 tablets PO daily.                            - Continue present medications.                           - Await pathology results.                           - Repeat colonoscopy for surveillance based on                            pathology results.                           - The findings and recommendations were discussed                            with the patient.                           - The findings and recommendations were discussed                            with the patient's family. Justice Britain, MD 04/10/2022 3:41:40 PM

## 2022-04-10 NOTE — Patient Instructions (Addendum)
Colonoscopy:   Follow high fiber diet recommended by Dr Rush Landmark  Use FiberCon 1-2 tablets by mouth daily (CAN PURCHASE OVER THE COUNTER  Try Miralax (over the counter ) per directions twice daily If this does not help ,please message Dr Rush Landmark on my chart or call the office to let them know .  Handouts on polyps,diverticulosis,& hemorrhoids given to you today  Await pathology results on polyps removed    Endoscopy:  Will consider a PPI ( reflux medication) medication based on final pathology results   Await biopsy results    YOU HAD AN ENDOSCOPIC PROCEDURE TODAY AT [facility]:   Refer to the procedure report that was given to you for any specific questions about what was found during the examination.  If the procedure report does not answer your questions, please call your gastroenterologist to clarify.  If you requested that your care partner not be given the details of your procedure findings, then the procedure report has been included in a sealed envelope for you to review at your convenience later.  YOU SHOULD EXPECT: Some feelings of bloating in the abdomen. Passage of more gas than usual.  Walking can help get rid of the air that was put into your GI tract during the procedure and reduce the bloating. If you had a lower endoscopy (such as a colonoscopy or flexible sigmoidoscopy) you may notice spotting of blood in your stool or on the toilet paper. If you underwent a bowel prep for your procedure, you may not have a normal bowel movement for a few days.  Please Note:  You might notice some irritation and congestion in your nose or some drainage.  This is from the oxygen used during your procedure.  There is no need for concern and it should clear up in a day or so.  SYMPTOMS TO REPORT IMMEDIATELY:  Following lower endoscopy (colonoscopy or flexible sigmoidoscopy):  Excessive amounts of blood in the stool  Significant tenderness or worsening of  abdominal pains  Swelling of the abdomen that is new, acute  Fever of 100F or higher  Following upper endoscopy (EGD)  Vomiting of blood or coffee ground material  New chest pain or pain under the shoulder blades  Painful or persistently difficult swallowing  New shortness of breath  Fever of 100F or higher  Black, tarry-looking stools  For urgent or emergent issues, a gastroenterologist can be reached at any hour by calling [phone]. Do not use MyChart messaging for urgent concerns.    DIET:  We do recommend a small meal at first, but then you may proceed to your regular diet.  Drink plenty of fluids but you should avoid alcoholic beverages for 24 hours.  ACTIVITY:  You should plan to take it easy for the rest of today and you should NOT DRIVE or use heavy machinery until tomorrow (because of the sedation medicines used during the test).    FOLLOW UP: Our staff will call the number listed on your records 48-72 hours following your procedure to check on you and address any questions or concerns that you may have regarding the information given to you following your procedure. If we do not reach you, we will leave a message.  We will attempt to reach you two times.  During this call, we will ask if you have developed any symptoms of COVID 19. If you develop any symptoms (ie: fever, flu-like symptoms, shortness of breath, cough etc.) before then, please call [phone].  If you test positive for Covid 19 in the 2 weeks post procedure, please call and report this information to [place].    If any biopsies were taken you will be contacted by phone or by letter within the next 1-3 weeks.  Please call us at [phone] if you have not heard about the biopsies in 3 weeks.    SIGNATURES/CONFIDENTIALITY: You and/or your care partner have signed paperwork which will be entered into your electronic medical record.  These signatures attest to the fact that that the information above on your  After Visit Summary has been reviewed and is understood.  Full responsibility of the confidentiality of this discharge information lies with you and/or your care-partner.

## 2022-04-10 NOTE — Progress Notes (Signed)
GASTROENTEROLOGY PROCEDURE H&P NOTE   Primary Care Physician: Vassie Moment, MD  HPI: Katherine Le is a 56 y.o. female who presents for EGD/Colonoscopy for evaluation of abdominal pain, GERD, history of H. Pylori, history of colon polyps due for surveillance.  Past Medical History:  Diagnosis Date   Anxiety    Blind in both eyes    Blood transfusion without reported diagnosis    MS (multiple sclerosis) ([place]) 2000   Prediabetes    Past Surgical History:  Procedure Laterality Date   [place]   COLONOSCOPY  2020   poor prep   EYE SURGERY Bilateral    10+ years ago   POLYPECTOMY     Current Outpatient Medications  Medication Sig Dispense Refill   Dimethyl Fumarate 240 MG CPDR One po bid 60 capsule 11   dorzolamide-timolol (COSOPT) 22.3-6.8 MG/ML ophthalmic solution 1 drop 2 (two) times daily.     Multiple Vitamins-Minerals (CENTRUM ADULT PO) Take by mouth.     traZODone (DESYREL) 100 MG tablet Take 100 mg by mouth at bedtime.     rosuvastatin (CRESTOR) 5 MG tablet Take 5 mg by mouth daily.     Current Facility-Administered Medications  Medication Dose Route Frequency Provider Last Rate Last Admin   0.9 %  sodium chloride infusion  500 mL Intravenous Once Mansouraty, Telford Nab., MD        Current Outpatient Medications:    Dimethyl Fumarate 240 MG CPDR, One po bid, Disp: 60 capsule, Rfl: 11   dorzolamide-timolol (COSOPT) 22.3-6.8 MG/ML ophthalmic solution, 1 drop 2 (two) times daily., Disp: , Rfl:    Multiple Vitamins-Minerals (CENTRUM ADULT PO), Take by mouth., Disp: , Rfl:    traZODone (DESYREL) 100 MG tablet, Take 100 mg by mouth at bedtime., Disp: , Rfl:    rosuvastatin (CRESTOR) 5 MG tablet, Take 5 mg by mouth daily., Disp: , Rfl:   Current Facility-Administered Medications:    0.9 %  sodium chloride infusion, 500 mL, Intravenous, Once, Mansouraty, Telford Nab., MD No Known Allergies Family History  Problem Relation Age of Onset   Diabetes  Mother    Diabetes Maternal Aunt    Diabetes Maternal Grandmother    Colon cancer Neg Hx    Esophageal cancer Neg Hx    Stomach cancer Neg Hx    Rectal cancer Neg Hx    Social History   Socioeconomic History   Marital status: Single    Spouse name: Not on file   Number of children: 3   Years of education: Not on file   Highest education level: 11th grade  Occupational History   Not on file  Tobacco Use   Smoking status: Every Day    Packs/day: 0.25    Years: 37.00    Pack years: 9.25    Types: Cigarettes   Smokeless tobacco: Never  Vaping Use   Vaping Use: Never used  Substance and Sexual Activity   Alcohol use: Not Currently    Comment: twice a year   Drug use: No   Sexual activity: Never  Other Topics Concern   Not on file  Social History Narrative   Lives with daughter currently, planning on moving alone   Left handed   Caffeine: rare/none   Social Determinants of Health   Financial Resource Strain: Not on file  Food Insecurity: Not on file  Transportation Needs: Not on file  Physical Activity: Not on file  Stress: Not on file  Social Connections: Not on  file  Intimate Partner Violence: Not on file    Physical Exam: Today's Vitals   04/10/22 1240  BP: 103/73  Pulse: 80  Temp: (!) 96.6 F (35.9 C)  TempSrc: Temporal  SpO2: 100%  Weight: 139 lb (63 kg)  Height: '5\' 1"'$  (1.549 m)   Body mass index is 26.26 kg/m. GEN: NAD EYE: Sclerae anicteric ENT: MMM CV: Non-tachycardic GI: Soft, NT/ND NEURO:  Alert & Oriented x 3  Lab Results: No results for input(s): WBC, HGB, HCT, PLT in the last 72 hours. BMET No results for input(s): NA, K, CL, CO2, GLUCOSE, BUN, CREATININE, CALCIUM in the last 72 hours. LFT No results for input(s): PROT, ALBUMIN, AST, ALT, ALKPHOS, BILITOT, BILIDIR, IBILI in the last 72 hours. PT/INR No results for input(s): LABPROT, INR in the last 72 hours.   Impression / Plan: This is a 56 y.o.female who presents for  EGD/Colonoscopy for evaluation of abdominal pain, GERD, history of H. Pylori, history of colon polyps due for surveillance.  The risks and benefits of endoscopic evaluation/treatment were discussed with the patient and/or family; these include but are not limited to the risk of perforation, infection, bleeding, missed lesions, lack of diagnosis, severe illness requiring hospitalization, as well as anesthesia and sedation related illnesses.  The patient's history has been reviewed, patient examined, no change in status, and deemed stable for procedure.  The patient and/or family is agreeable to proceed.    Justice Britain, MD [facility] Office # [phone]

## 2022-04-10 NOTE — Progress Notes (Signed)
Vss nad trans to pacu °

## 2022-04-15 ENCOUNTER — Telehealth: Payer: Self-pay | Admitting: *Deleted

## 2022-04-15 NOTE — Telephone Encounter (Signed)
  Follow up Call-     04/10/2022   12:40 PM  Call back number  Post procedure Call Back phone  # [phone]  Permission to leave phone message Yes     Patient questions:  Do you have a fever, pain , or abdominal swelling? No. Pain Score  0 *  Have you tolerated food without any problems? Yes.    Have you been able to return to your normal activities? Yes.    Do you have any questions about your discharge instructions: Diet   No. Medications  No. Follow up visit  No.  Do you have questions or concerns about your Care? No.  Actions: * If pain score is 4 or above: No action needed, pain <4.

## 2022-04-16 ENCOUNTER — Encounter: Payer: Self-pay | Admitting: Gastroenterology

## 2022-04-23 ENCOUNTER — Encounter: Payer: Self-pay | Admitting: Neurology

## 2022-04-23 ENCOUNTER — Ambulatory Visit (INDEPENDENT_AMBULATORY_CARE_PROVIDER_SITE_OTHER): Payer: Medicaid Other | Admitting: Neurology

## 2022-04-23 VITALS — BP 132/75 | HR 74 | Ht 61.0 in | Wt 126.0 lb

## 2022-04-23 DIAGNOSIS — R200 Anesthesia of skin: Secondary | ICD-10-CM

## 2022-04-23 DIAGNOSIS — G35 Multiple sclerosis: Secondary | ICD-10-CM | POA: Diagnosis not present

## 2022-04-23 DIAGNOSIS — R269 Unspecified abnormalities of gait and mobility: Secondary | ICD-10-CM

## 2022-04-23 DIAGNOSIS — Z79899 Other long term (current) drug therapy: Secondary | ICD-10-CM

## 2022-04-23 MED ORDER — PREDNISONE 50 MG PO TABS: ORAL_TABLET | ORAL | 0 refills | Status: AC

## 2022-04-23 NOTE — Progress Notes (Signed)
[facility]  PATIENT: Katherine Le DOB: 09/09/1966  REFERRING DOCTOR OR PCP: Vassie Moment, MD SOURCE: Patient, notes from primary care, office and laboratory and imaging results through care everywhere  _________________________________   HISTORICAL  CHIEF COMPLAINT:  Chief Complaint  Patient presents with   Follow-up    Rm 2, alone. Here to f/u for MS, on Tecfidera and tolerating well. Pt has been having stomach issues. Had a colonoscopy last week. Pt has lost 13lbs since last OV. Continues to have burning R leg, numbness has worsened. Now having numbness in L leg, vaginal and buttocks area.     HISTORY OF PRESENT ILLNESS:  Katherine Le is a 56 y.o. woman with multiple sclerosis.  Update 04/23/2022: She reports 3-4 days of leg numbness.   It started on the right and is now bilateral.   No arm numbness.  No facial numbness.   Balance seems worse..  She has a limp.    No new weakness.    No change in bladder.     She has reduced vision OS worse than OD but has a history of panuveitis.  She is followed by ophthalmology..    Her gait is doing and a bit worse.  She has a baseline limp due to leg weakness, spasticity and reduced balance.  She notes no change in bowel or bladder.  She has fatigue everyday, worse than a few years ago.   Sleep is poor at night due to both sleep onset and sleep maintenance insomnia.     She has anxiety and depression.  She gets frustrated easily.    She feels cognition is fine.      MS History: She was diagnosed with MS in 2019 after presenting with leg paresthesias.  She had a burning sensation in her right leg from hip to feet.    She had an LP 02/12/2018 showing oligoclonal bands in CSF not present in serum.   MBP was mildly positive.    In retrospect, she had a limp back in 2011/2012.    She di have bran MRIs at that time, worrisome for MS but was not diagnosed.  She was diagnosed by Dr. Wynelle Fanny (?).    She was placed on  Tecfidera.   She tolerated it well.    She  has lived in the [place] area most of the past but moved to [place] last year.     She saw Dr. Shela Leff in [place] who re-initiated the DMF.    However, she went back to [place] for a while so did not start.   She had a sensory exacerbation 04/2022 while on DMF.     IMAGING: She had an MRI of the brain in 2017 but we do not have those results  MRI 3/202/2023 brain showed T2/FLAIR hyperintense foci in the cerebral hemispheres in a pattern most consistent with chronic demyelinating plaque associated with multiple sclerosis, though chronic microvascular ischemic change is also possible.  The plaque burden is low.     Labs: LP 02/12/2018 showing oligoclonal bands in CSF not present in serum.   MBP was mildly positive.   CSF ACE, VDRL and Lyme were negative Labs 12/28/2018 showed normal ESR and CRP 4/18//2022 Vit D = 16.4; Hep B, Hep C negative, TSH normal.   EKG 10/04/2020 showed NSR with normal intervals and no ischemic changes.     REVIEW OF SYSTEMS: Constitutional: No fevers, chills, sweats, or change in appetite Eyes: No visual changes, double vision, eye  pain Ear, nose and throat: No hearing loss, ear pain, nasal congestion, sore throat Cardiovascular: No chest pain, palpitations Respiratory:  No shortness of breath at rest or with exertion.   No wheezes GastrointestinaI: No nausea, vomiting, diarrhea, abdominal pain, fecal incontinence Genitourinary:  No dysuria, urinary retention or frequency.  No nocturia. Musculoskeletal:  No neck pain, back pain Integumentary: No rash, pruritus, skin lesions Neurological: as above Psychiatric: No depression at this time.  No anxiety Endocrine: No palpitations, diaphoresis, change in appetite, change in weigh or increased thirst Hematologic/Lymphatic:  No anemia, purpura, petechiae. Allergic/Immunologic: No itchy/runny eyes, nasal congestion, recent allergic reactions, rashes  ALLERGIES: No Known  Allergies  HOME MEDICATIONS:  Current Outpatient Medications:    Dimethyl Fumarate 240 MG CPDR, One po bid, Disp: 60 capsule, Rfl: 11   dorzolamide-timolol (COSOPT) 22.3-6.8 MG/ML ophthalmic solution, 1 drop 2 (two) times daily., Disp: , Rfl:    Multiple Vitamins-Minerals (CENTRUM ADULT PO), Take by mouth., Disp: , Rfl:    rosuvastatin (CRESTOR) 5 MG tablet, Take 5 mg by mouth daily., Disp: , Rfl:    traZODone (DESYREL) 100 MG tablet, Take 100 mg by mouth at bedtime., Disp: , Rfl:   PAST MEDICAL HISTORY: Past Medical History:  Diagnosis Date   Anxiety    Blind in both eyes    Blood transfusion without reported diagnosis    MS (multiple sclerosis) ([place]) 2000   Prediabetes     PAST SURGICAL HISTORY: Past Surgical History:  Procedure Laterality Date   CESAREAN SECTION  1987   COLONOSCOPY  2020   poor prep   EYE SURGERY Bilateral    10+ years ago   POLYPECTOMY      FAMILY HISTORY: Family History  Problem Relation Age of Onset   Diabetes Mother    Diabetes Maternal Aunt    Diabetes Maternal Grandmother    Colon cancer Neg Hx    Esophageal cancer Neg Hx    Stomach cancer Neg Hx    Rectal cancer Neg Hx     SOCIAL HISTORY:  Social History   Socioeconomic History   Marital status: Single    Spouse name: Not on file   Number of children: 3   Years of education: Not on file   Highest education level: 11th grade  Occupational History   Not on file  Tobacco Use   Smoking status: Every Day    Packs/day: 0.25    Years: 37.00    Pack years: 9.25    Types: Cigarettes   Smokeless tobacco: Never  Vaping Use   Vaping Use: Never used  Substance and Sexual Activity   Alcohol use: Not Currently    Comment: twice a year   Drug use: No   Sexual activity: Never  Other Topics Concern   Not on file  Social History Narrative   Lives with daughter currently, planning on moving alone   Left handed   Caffeine: rare/none   Social Determinants of Health   Financial  Resource Strain: Not on file  Food Insecurity: Not on file  Transportation Needs: Not on file  Physical Activity: Not on file  Stress: Not on file  Social Connections: Not on file  Intimate Partner Violence: Not on file     PHYSICAL EXAM  Vitals:   04/23/22 1325  BP: 132/75  Pulse: 74  Weight: 126 lb (57.2 kg)  Height: _0  (1.549 m)    Body mass index is 23.81 kg/m.   No results found.  General: The patient is well-developed and well-nourished and in no acute distress  HEENT:  Head is [place]/AT.  Sclera are anicteric.     Musculoskeletal:  Back is nontender  Neurologic Exam  Mental status: The patient is alert and oriented x 3 at the time of the examination. The patient has apparent normal recent and remote memory, with an apparently normal attention span and concentration ability.   Speech is normal.  Cranial nerves: Extraocular movements are full. Pupils are equal, round, and reactive to light and accomodation.  Facial strength and sensation was normal.  There is mild ptosis on the left (the eye that has worse vision.). No obvious hearing deficits are noted.  Motor:  Muscle bulk is normal.   Tone is normal. Strength is  5 / 5 in the arms.  Rapid alternating movements were performed well in the hands.  She has mildly reduced strength in the left leg with 4+/5 iliopsoas and 4/5 foot and ankle extension.  Strength was 4++/5 in the right iliopsoas and 5/5 elsewhere in the right leg..   Sensory: She reports reduced sensation to temperature and touch from the right mid back down on the right and much milder reduction on the left.  There is reduced sensation to touch and temperature in the right leg relative to the left leg.  Vibration sensation was more symmetric.  Coordination: Cerebellar testing reveals good finger-nose-finger and reduced heel-to-shin on the left.   Gait and station: Station is normal.   She is able to walk without a cane but the gait is wide and stride is  reduced.  She has a mild foot drop on the left.  She cannot do a tandem walk.  Romberg is negative.  Reflexes: Deep tendon reflexes are symmetric and normal in the arms and mildly increased in the left leg relative to the right leg.Marland Kitchen     DIAGNOSTIC DATA (LABS, IMAGING, TESTING) - I reviewed patient records, labs, notes, testing and imaging myself where available.  Lab Results  Component Value Date   WBC 4.5 12/19/2021   HGB 11.5 12/19/2021   HCT 35.0 12/19/2021   MCV 87 12/19/2021   PLT 193 12/19/2021      Component Value Date/Time   NA 139 12/19/2021 1421   K 4.3 12/19/2021 1421   CL 103 12/19/2021 1421   CO2 26 12/19/2021 1421   GLUCOSE 91 12/19/2021 1421   GLUCOSE 101 (H) 04/27/2021 0005   BUN 9 12/19/2021 1421   CREATININE 0.63 12/19/2021 1421   CALCIUM 9.8 12/19/2021 1421   PROT 7.7 12/19/2021 1421   ALBUMIN 4.9 12/19/2021 1421   AST 18 12/19/2021 1421   ALT 14 12/19/2021 1421   ALKPHOS 66 12/19/2021 1421   BILITOT 0.6 12/19/2021 1421   GFRNONAA >60 04/27/2021 0005   GFRAA >60 04/26/2017 1447       ASSESSMENT AND PLAN  Multiple sclerosis (HCC)  High risk medication use  Gait disturbance  Numbness   Likely sensory exacerbation, suspect spinal plaque.   Need to consider a different DMT (currently on dimethyl fumarate and reports reasonable compliance).   We will do IV Solumedrol today and 3 days of high dose prednisone.  Additionally we will check an MRI of the cervical thoracic spine to further evaluate and make sure that there is not an extrinsic myelopathy responsible for her symptoms. Change from dimethyl fumarate to an anti-CD20 agent (Kesimpta,  or Ocrevus).  Since she does not drive, Kesimpta may work better for her.  Check labs Return in 6 months or sooner if there are new or worsening neurologic symptoms.  45-minute office visit with the majority of the time spent face-to-face for history and physical, discussion/counseling and decision-making  and setting up infusion for her.  Additional time with record review and documentation.   Ivey Cina A. Felecia Shelling, MD, [facility] 05/22/7010, 0:03 PM Certified in Neurology, Clinical Neurophysiology, Sleep Medicine and Neuroimaging  [facility] [address], [place], [place] [address] [phone]

## 2022-04-26 LAB — QUANTIFERON-TB GOLD PLUS
QuantiFERON Mitogen Value: >10 IU/mL
QuantiFERON Nil Value: 0 IU/mL
QuantiFERON TB1 Ag Value: 0.02 IU/mL
QuantiFERON TB2 Ag Value: 0.02 IU/mL
QuantiFERON-TB Gold Plus: NEGATIVE

## 2022-04-26 LAB — IGG, IGA, IGM
IgA/Immunoglobulin A, Serum: 135 mg/dL (ref 87–352)
IgG (Immunoglobin G), Serum: 1636 mg/dL — ABNORMAL HIGH (ref 586–1602)
IgM (Immunoglobulin M), Srm: 82 mg/dL (ref 26–217)

## 2022-04-26 LAB — VARICELLA ZOSTER ANTIBODY, IGG: Varicella zoster IgG: 3590 index (ref >165)

## 2022-04-28 ENCOUNTER — Telehealth: Payer: Self-pay

## 2022-04-28 NOTE — Telephone Encounter (Signed)
I called patient to discuss her lab work and her medication preference.  No answer, left a voicemail asking her to call back.

## 2022-04-28 NOTE — Telephone Encounter (Signed)
-----   Message from Britt Bottom, MD sent at 04/27/2022  5:15 PM EDT ----- She had an exacerbation last week and we are going to switch her to an anti-CD20 agent.  She does not drive and Kesimpta may work better for her..  If she prefers Ocrevus or Briumvi that is also fine   The lab work was fine.

## 2022-04-29 NOTE — Telephone Encounter (Signed)
I called patient. I discussed this with her. She would prefer to start Ocrevus rather than kesimpta. She has [place] Medicaid. I confirmed with the infusion suite that the do not accept her insurance. She will need to be infused at the [facility].  Ocrevus start form faxed to [facility]. Received a receipt of confirmation.  Completed PA for Ocrevus via [place]. Sent to [facility]. Key: [id number]. Should have a determination within 3-5 business days.

## 2022-05-01 NOTE — Telephone Encounter (Signed)
Called pt at [phone]. Advised PA Ocrevus denied but we are going to attempt to appeal this decision. Will need her to sign consent to appeal. Emailed to her daughter at [email or web address] per pt request. She will sign and email back.

## 2022-05-05 NOTE — Telephone Encounter (Signed)
Received appeal consent from patient. Faxed appeal consent and appeal letter for Ocrevus to [facility]. Received a receipt of confirmation.

## 2022-05-13 ENCOUNTER — Other Ambulatory Visit: Payer: Self-pay

## 2022-05-13 ENCOUNTER — Telehealth: Payer: Self-pay | Admitting: Neurology

## 2022-05-13 DIAGNOSIS — G35 Multiple sclerosis: Secondary | ICD-10-CM

## 2022-05-13 NOTE — Telephone Encounter (Signed)
Medicaid [employer] ref: [id number] pending faxed notes

## 2022-05-14 ENCOUNTER — Telehealth: Payer: Self-pay | Admitting: Neurology

## 2022-05-14 NOTE — Telephone Encounter (Signed)
Cervical spine medicaid [employer] auth: [id number] exp. 05/13/22-07/12/22 sent to GI  Thoracic spine auth: [id number] exp. 05/13/22-07/12/22

## 2022-05-26 NOTE — Telephone Encounter (Signed)
I called patient care center.  The nurse was too busy to help me at this time.  I was encouraged to call back in about 30 minutes at [phone] so that she can check the status of this order.

## 2022-05-26 NOTE — Telephone Encounter (Signed)
I called the [name]. They need patient to call them at [phone] to schedule her Ocrevus infusions. I called patient. I advised her of this. She will call them today.

## 2022-05-27 ENCOUNTER — Other Ambulatory Visit: Payer: Medicaid Other

## 2022-06-09 ENCOUNTER — Non-Acute Institutional Stay (HOSPITAL_COMMUNITY)
Admission: RE | Admit: 2022-06-09 | Discharge: 2022-06-09 | Disposition: A | Discharge status: Home / Self Care | Payer: Medicaid Other | Source: Ambulatory Visit | Attending: Internal Medicine | Admitting: Internal Medicine

## 2022-06-09 DIAGNOSIS — G35 Multiple sclerosis: Secondary | ICD-10-CM

## 2022-06-09 MED ORDER — SODIUM CHLORIDE 0.9 % IV SOLN
300.0000 mg | INTRAVENOUS | Status: DC
  Administered 2022-06-09: 300 mg via INTRAVENOUS
  Filled 2022-06-09: qty 10

## 2022-06-09 MED ORDER — ACETAMINOPHEN 325 MG PO TABS
650.0000 mg | ORAL_TABLET | Freq: Once | ORAL | Status: AC
  Administered 2022-06-09: 650 mg via ORAL
  Filled 2022-06-09: qty 2

## 2022-06-09 MED ORDER — SODIUM CHLORIDE 0.9 % IV SOLN: INTRAVENOUS | Status: DC | PRN

## 2022-06-09 MED ORDER — METHYLPREDNISOLONE SODIUM SUCC 125 MG IJ SOLR
125.0000 mg | Freq: Once | INTRAMUSCULAR | Status: AC
  Administered 2022-06-09: 125 mg via INTRAVENOUS
  Filled 2022-06-09: qty 2

## 2022-06-09 MED ORDER — DIPHENHYDRAMINE HCL 50 MG/ML IJ SOLN
50.0000 mg | Freq: Once | INTRAMUSCULAR | Status: AC
  Administered 2022-06-09: 50 mg via INTRAVENOUS
  Filled 2022-06-09: qty 1

## 2022-06-09 MED ORDER — FAMOTIDINE IN NACL 20-0.9 MG/50ML-% IV SOLN
20.0000 mg | Freq: Once | INTRAVENOUS | Status: AC
  Administered 2022-06-09: 20 mg via INTRAVENOUS
  Filled 2022-06-09: qty 50

## 2022-06-09 NOTE — Telephone Encounter (Signed)
Patient is currently receiving her first Ocrevus '300mg'$  IV infusion at the [facility] today.

## 2022-06-09 NOTE — Progress Notes (Signed)
PATIENT CARE CENTER NOTE   Diagnosis: Multiple sclerosis ([place]) [G35]   Provider: Arlice Colt, MD   Procedure: Ocrevus 300 mg infusion    Note:  Patient received Ocrevus 300 mg infusion (dose 1 of 2) via PIV. Pre-medications given per order. Patient tolerated infusion well with no adverse reaction. Observed patient for 1 hour post infusion. Discharge instructions given. Patient to come back in 14 days for second infusion and will schedule next appointment at the front desk. Patient alert, oriented and ambulatory at discharge.

## 2022-06-10 ENCOUNTER — Ambulatory Visit
Admission: RE | Admit: 2022-06-10 | Discharge: 2022-06-10 | Disposition: A | Discharge status: Home / Self Care | Payer: Medicaid Other | Source: Ambulatory Visit | Attending: Neurology | Admitting: Neurology

## 2022-06-10 DIAGNOSIS — R200 Anesthesia of skin: Secondary | ICD-10-CM

## 2022-06-10 DIAGNOSIS — R269 Unspecified abnormalities of gait and mobility: Secondary | ICD-10-CM

## 2022-06-10 DIAGNOSIS — G35 Multiple sclerosis: Secondary | ICD-10-CM

## 2022-06-10 MED ORDER — GADOBENATE DIMEGLUMINE 529 MG/ML IV SOLN
13.0000 mL | Freq: Once | INTRAVENOUS | Status: AC | PRN
  Administered 2022-06-10: 13 mL via INTRAVENOUS

## 2022-06-11 ENCOUNTER — Telehealth: Payer: Self-pay | Admitting: Neurology

## 2022-06-11 DIAGNOSIS — G959 Disease of spinal cord, unspecified: Secondary | ICD-10-CM

## 2022-06-11 DIAGNOSIS — R269 Unspecified abnormalities of gait and mobility: Secondary | ICD-10-CM

## 2022-06-11 DIAGNOSIS — M4802 Spinal stenosis, cervical region: Secondary | ICD-10-CM

## 2022-06-11 NOTE — Telephone Encounter (Signed)
I spoke to Katherine Le about the MRI of the cervical and thoracic spine results.  I am most concerned about the severe spinal stenosis that she has at several levels due to congenitally short pedicles and degenerative change.  There appears to be a small myelopathic focus within the spinal cord towards the right at C3-C4 (AP diameter of the central canal is only 4.6 mm at that level)  She has subtle T2 hyperintensity posteriorly at C5-C6 and another focus in the thoracic spine from MS but I am most concerned about the severe spinal stenosis and myelopathic signal in the neck.    The thoracic spine showed degenerative changes but no spinal stenosis  I discussed referring her to neurosurgery for an additional opinion and possible cervical surgery.Marland Kitchen

## 2022-06-12 NOTE — Telephone Encounter (Signed)
Referral for Neurosurgery sent to  [facility] [phone]. 

## 2022-06-23 ENCOUNTER — Encounter (HOSPITAL_COMMUNITY): Payer: Medicaid Other

## 2022-06-27 ENCOUNTER — Non-Acute Institutional Stay (HOSPITAL_COMMUNITY)
Admission: RE | Admit: 2022-06-27 | Discharge: 2022-06-27 | Disposition: A | Discharge status: Home / Self Care | Payer: Medicaid Other | Source: Ambulatory Visit | Attending: Internal Medicine | Admitting: Internal Medicine

## 2022-06-27 DIAGNOSIS — G35 Multiple sclerosis: Secondary | ICD-10-CM | POA: Insufficient documentation

## 2022-06-27 MED ORDER — SODIUM CHLORIDE 0.9 % IV SOLN: INTRAVENOUS | Status: DC | PRN

## 2022-06-27 MED ORDER — ACETAMINOPHEN 325 MG PO TABS
650.0000 mg | ORAL_TABLET | Freq: Once | ORAL | Status: AC
  Administered 2022-06-27: 650 mg via ORAL
  Filled 2022-06-27: qty 2

## 2022-06-27 MED ORDER — FAMOTIDINE IN NACL 20-0.9 MG/50ML-% IV SOLN
20.0000 mg | Freq: Once | INTRAVENOUS | Status: AC
  Administered 2022-06-27: 20 mg via INTRAVENOUS
  Filled 2022-06-27: qty 50

## 2022-06-27 MED ORDER — SODIUM CHLORIDE 0.9 % IV SOLN
300.0000 mg | Freq: Once | INTRAVENOUS | Status: AC
  Administered 2022-06-27: 300 mg via INTRAVENOUS
  Filled 2022-06-27: qty 10

## 2022-06-27 MED ORDER — METHYLPREDNISOLONE SODIUM SUCC 125 MG IJ SOLR
125.0000 mg | Freq: Once | INTRAMUSCULAR | Status: AC
  Administered 2022-06-27: 125 mg via INTRAVENOUS
  Filled 2022-06-27: qty 2

## 2022-06-27 MED ORDER — DIPHENHYDRAMINE HCL 50 MG/ML IJ SOLN
50.0000 mg | Freq: Once | INTRAMUSCULAR | Status: AC
  Administered 2022-06-27: 50 mg via INTRAVENOUS
  Filled 2022-06-27: qty 1

## 2022-06-27 NOTE — Progress Notes (Addendum)
PATIENT CARE CENTER NOTE:  Provider: Arlice Colt MD  Diagnosis: Multiple Sclerosis G35  Procedure: Ocrevus '300mg'$  infusion    Patient received IV Ocrevus '300mg'$  ( dose # 2 of 2).  Pre infusion medications - Tylenol PO, Benadryl IV, Solu-medrol IV, Pepcid IV were given. Medication was titrated per protocol. No adverse reactions noted. Tolerated well, vitals stable, discharge instructions given, verbalized understanding.  Patient declined to wait for the 60 minutes post infusion observation, states she feels well and her transportation arrived to get her. Pt to RTC in 6 months for next infusion, states she will call to schedule next infusion but she may be moving out of state and will let us know if she cannot return to this center. Patient alert, oriented and ambulatory at the time of discharge.

## 2022-10-15 IMAGING — CT CT ABD-PELV W/ CM
2 of 5 series · 16 of 46 positions shown, 18 images · IV contrast (omnipaque)
Comparison: 04/26/2017

CLINICAL DATA: Epigastric abdominal pain for 3 days.

EXAM:
CT ABDOMEN AND PELVIS WITH CONTRAST
TECHNIQUE: Multidetector CT imaging of the abdomen and pelvis was performed
using the standard protocol following bolus administration of
intravenous contrast.
CONTRAST:  75mL OMNIPAQUE IOHEXOL 300 MG/ML  SOLN

[Series 3: a/p w/ 5mm · axial · 0.67mm/px · z∈[-400,-50]mm · 13 of 80 slices shown, 15 images]
[im 5/80  soft-tissue]
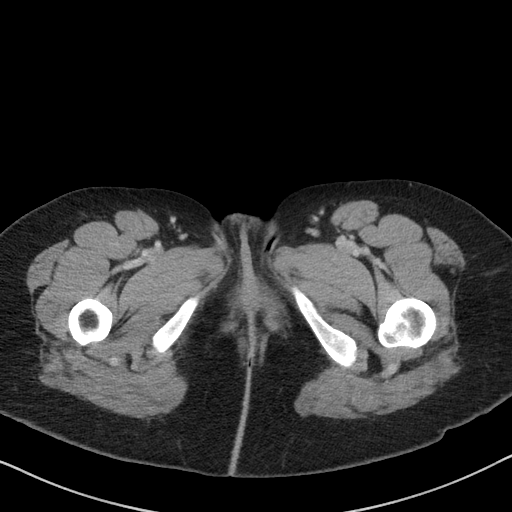
[im 5/80  bone]
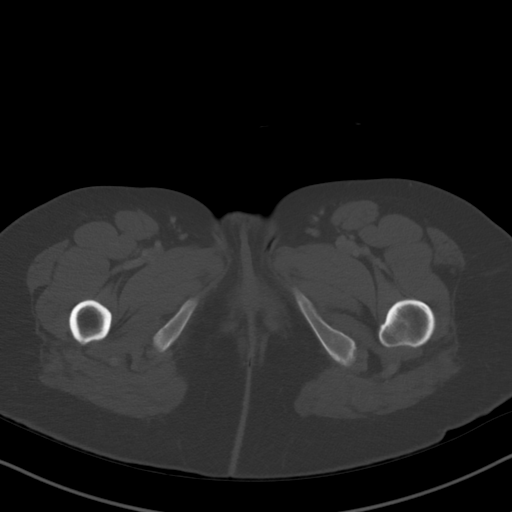
[im 13/80  soft-tissue]
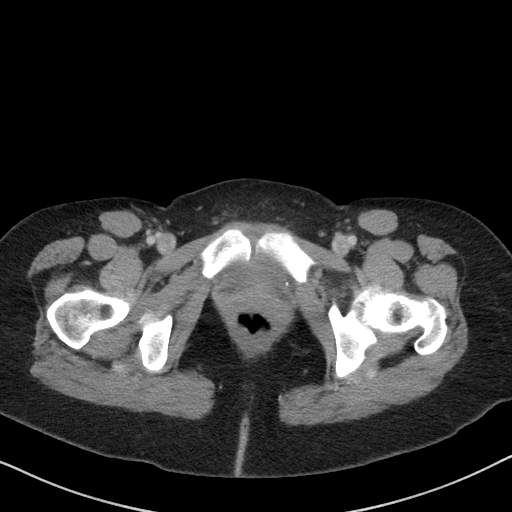
[im 17/80  soft-tissue]
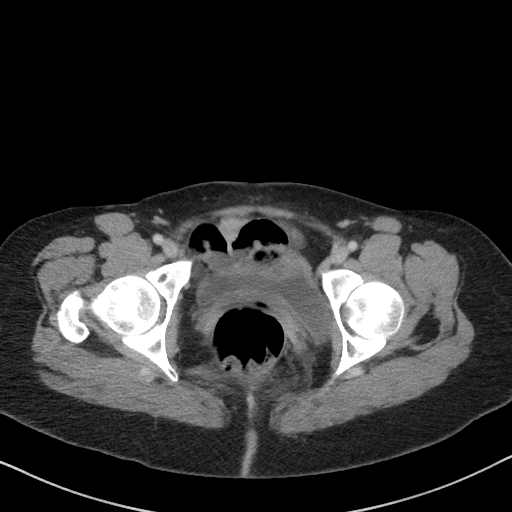
[im 21/80  soft-tissue]
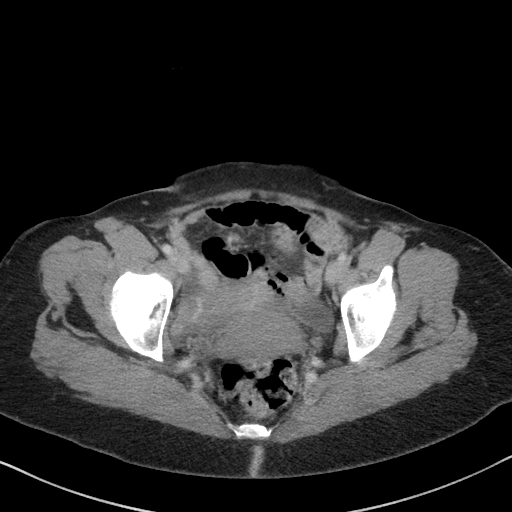
[im 30/80  soft-tissue]
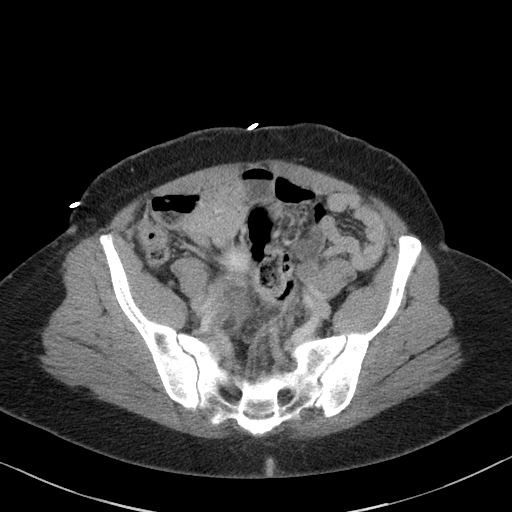
[im 34/80  soft-tissue]
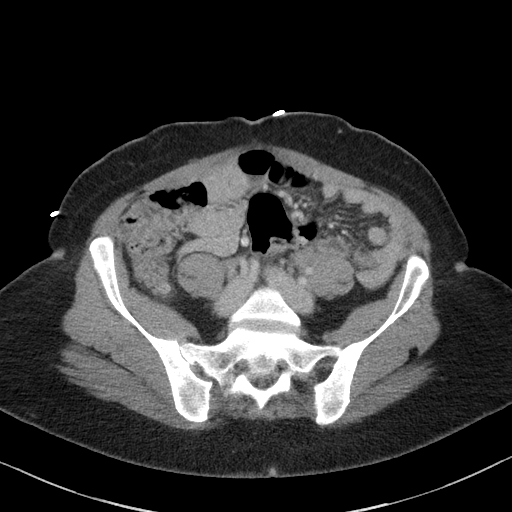
[im 42/80  soft-tissue]
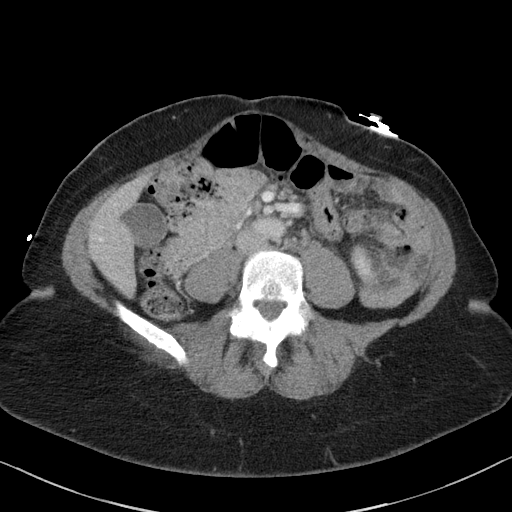
[im 46/80  soft-tissue]
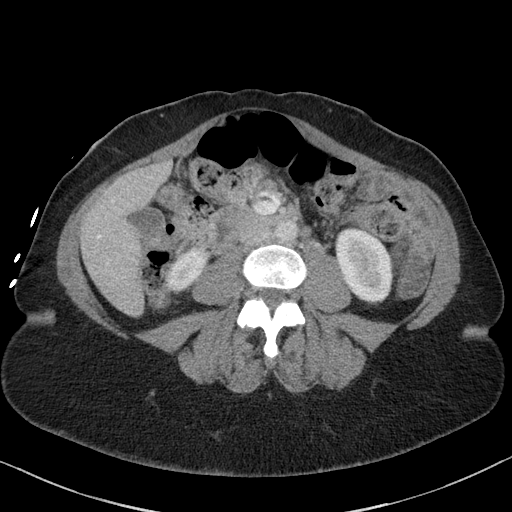
[im 50/80  soft-tissue]
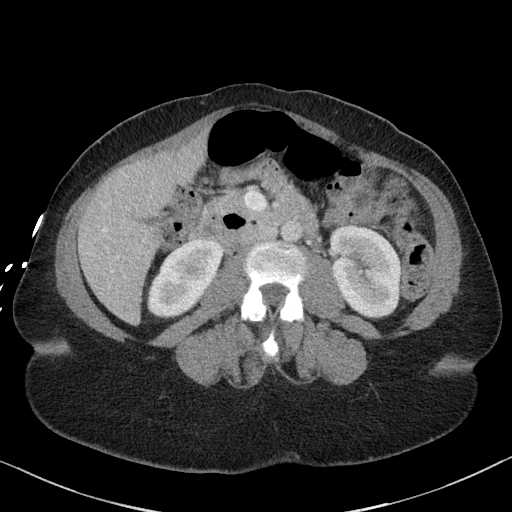
[im 50/80  bone]
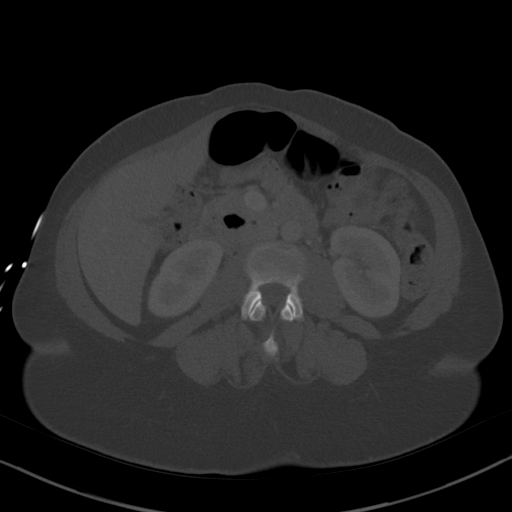
[im 59/80  soft-tissue]
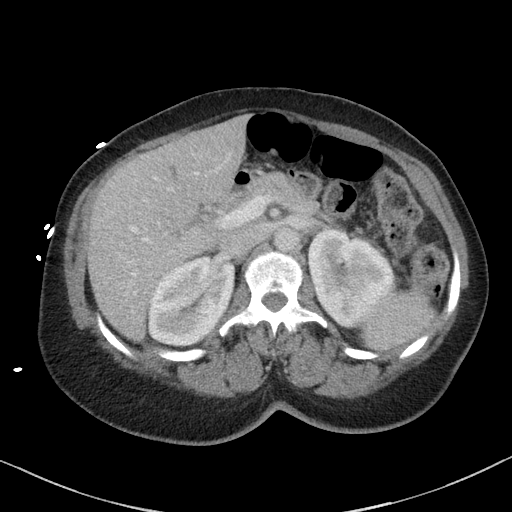
[im 63/80  soft-tissue]
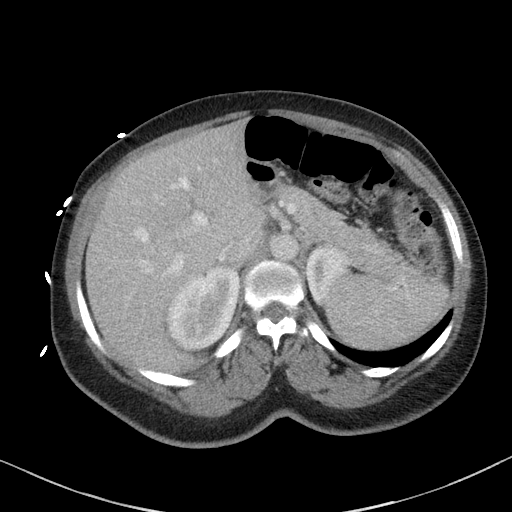
[im 67/80  soft-tissue]
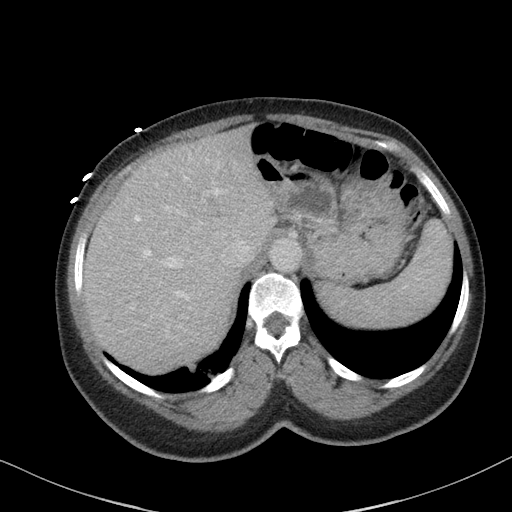
[im 75/80  soft-tissue]
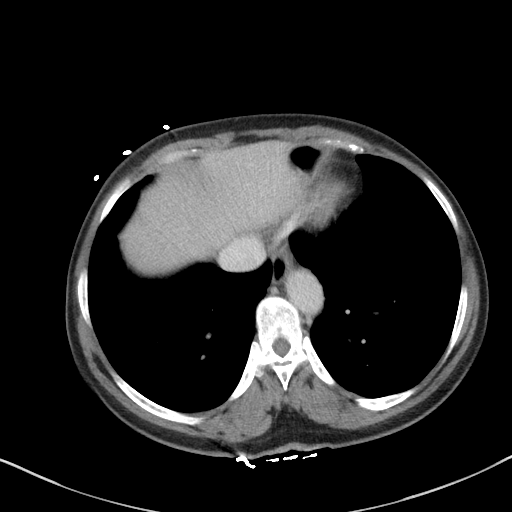

[Series 6: a/p w/ cor · coronal · 0.60mm/px · 3 of 121 slices shown]
[im 41/121  soft-tissue]
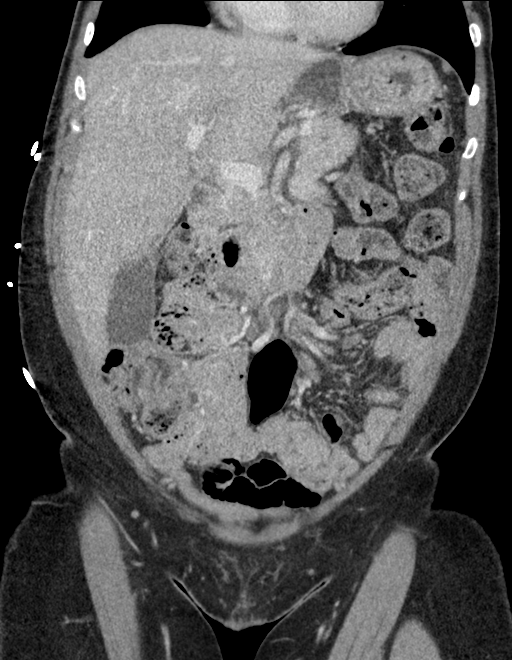
[im 54/121  soft-tissue]
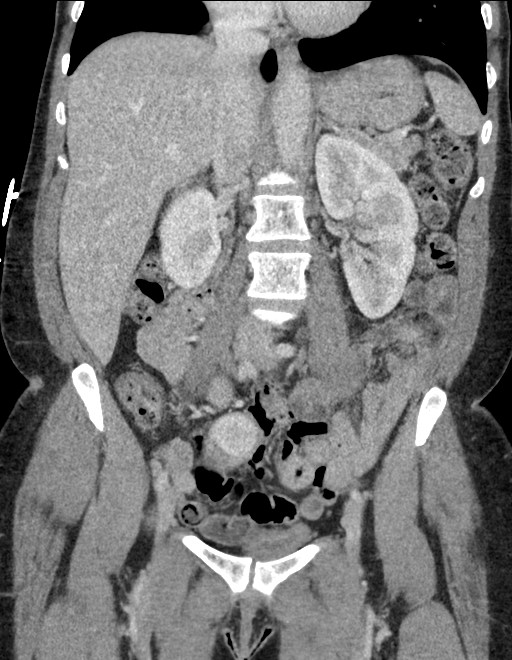
[im 67/121  soft-tissue]
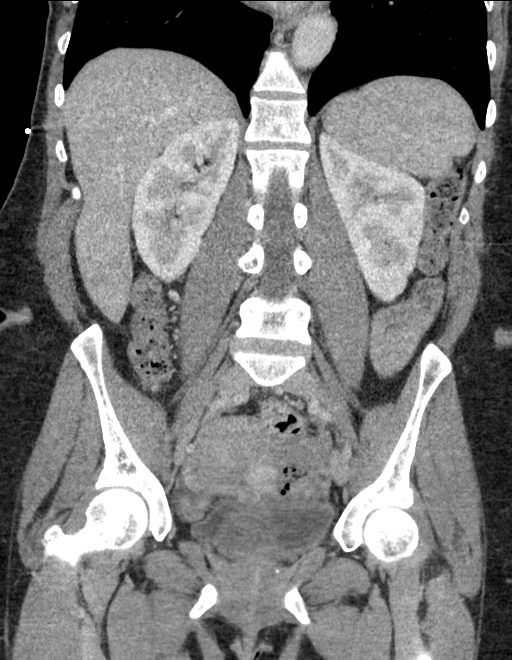

[16 of 46 positions shown; findings below may reference images not displayed]

FINDINGS: Lower chest: Patchy nodular density at the right lung base, not
present on the prior study. This could be an area of post pneumonic
scarring change. No pleural effusion. The heart is normal in size.
No pericardial effusion.

Hepatobiliary: No hepatic lesions or intrahepatic biliary
dilatation. Gallbladder is unremarkable. No common bile duct
dilatation.

Pancreas: No mass, inflammation or ductal dilatation.

Spleen: Normal size.  No focal lesions.

Adrenals/Urinary Tract: The adrenal glands are unremarkable. No
renal lesions or renal calculi or hydronephrosis. The bladder is
grossly normal.

Stomach/Bowel: The stomach, duodenum, small bowel and colon are
grossly normal without oral contrast. No acute inflammatory process,
mass lesions or obstructive findings. Duodenal diverticulum noted.
The appendix is not identified for certain but do not see any
findings suspicious for acute appendicitis.

Vascular/Lymphatic: The aorta is normal in caliber. No dissection.
The branch vessels are patent. The major venous structures are
patent. No mesenteric or retroperitoneal mass or adenopathy. Small
scattered lymph nodes are noted.

Reproductive: Enlarged fibroid uterus. The ovaries are unremarkable.

Other: No pelvic mass or adenopathy. No free pelvic fluid
collections. No inguinal mass or adenopathy. No abdominal wall
hernia or subcutaneous lesions.

Musculoskeletal: No significant bony findings.
IMPRESSION: 1. No acute abdominal/pelvic findings, mass lesions or adenopathy.
2. Enlarged fibroid uterus.
3. Patchy nodular opacity at the right lung base. Recommend
correlation with any pulmonary symptoms. A follow-up chest CT in 3-4
months is suggested to make sure this resolves.

## 2022-10-23 ENCOUNTER — Telehealth: Payer: Self-pay | Admitting: Neurology

## 2022-10-23 NOTE — Telephone Encounter (Signed)
Referral for Ophthalmology sent to [facility] as requested by neurologist. Phone: [phone], Fax: [phone]

## 2022-10-30 ENCOUNTER — Ambulatory Visit: Payer: Self-pay | Admitting: Neurology
